# Patient Record
Sex: Male | Born: 1955 | Race: White | Hispanic: No | Marital: Single | Smoking: Current some day smoker
Health system: Southern US, Community
[De-identification: ages and names within clinical notes are randomized; demographics above are authoritative.]

## PROBLEM LIST (undated history)

## (undated) DIAGNOSIS — L719 Rosacea, unspecified: Secondary | ICD-10-CM

## (undated) DIAGNOSIS — T7840XA Allergy, unspecified, initial encounter: Secondary | ICD-10-CM

## (undated) HISTORY — PX: RETINAL DETACHMENT SURGERY: SHX105

## (undated) HISTORY — DX: Allergy, unspecified, initial encounter: T78.40XA

## (undated) HISTORY — DX: Rosacea, unspecified: L71.9

---

## 2003-09-02 ENCOUNTER — Emergency Department (HOSPITAL_COMMUNITY): Admission: EM | Admit: 2003-09-02 | Discharge: 2003-09-02 | Payer: Self-pay | Admitting: Emergency Medicine

## 2006-01-14 ENCOUNTER — Ambulatory Visit: Payer: Self-pay | Admitting: Family Medicine

## 2009-02-07 ENCOUNTER — Ambulatory Visit: Payer: Self-pay | Admitting: Family Medicine

## 2009-05-28 ENCOUNTER — Ambulatory Visit: Payer: Self-pay | Admitting: Family Medicine

## 2009-05-29 ENCOUNTER — Encounter: Admission: RE | Admit: 2009-05-29 | Discharge: 2009-05-29 | Payer: Self-pay | Admitting: Family Medicine

## 2009-05-29 ENCOUNTER — Ambulatory Visit: Payer: Self-pay | Admitting: Family Medicine

## 2009-06-12 ENCOUNTER — Ambulatory Visit: Payer: Self-pay | Admitting: Family Medicine

## 2009-07-24 ENCOUNTER — Ambulatory Visit: Payer: Self-pay | Admitting: Family Medicine

## 2010-07-19 NOTE — Consult Note (Signed)
NAMEWELDEN, Justin Peck                           ACCOUNT NO.:  [id number]   MEDICAL RECORD NO.:  [id number]                   PATIENT TYPE:  EMS   LOCATION:  [facility]                                 FACILITY:  [facility]   PHYSICIAN:  Jefry H. Pollyann Kennedy, M.D.                DATE OF BIRTH:  1955/05/25   DATE OF CONSULTATION:  09/02/2003  DATE OF DISCHARGE:                                   CONSULTATION   SITE:  [facility] emergency department at  11:40 a.m.   HISTORY:  A 55 year old gentleman with a history of severe sore throat since  Thursday, getting worse, mainly on the left side.  He has had difficulty  swallowing today and developed some swelling in the left side of the neck.  There is no prior history of throat problems. He had not seen a physician  for this.   PAST MEDICAL HISTORY:  Negative.   SURGICAL HISTORY:  Negative.   MEDICATIONS:  He smokes a pipe.   PHYSICAL EXAMINATION:  GENERAL:  He is a healthy-appearing gentleman, in  some discomfort, but no distress.  HEENT:  Ears are filled with dried cerumen.  Nasal exam is clear.  Oral  cavity and pharynx significant for mild trismus, fullness and edema of the  left side of his soft palpate with displacement of the tonsil and uvula.  There is some slightly tender adenopathy left anterior jugular chain.   IMPRESSION:  Suspicious for peritonsillar abscess.   PLAN:  Perform incision and drainage.   PROCEDURE NOTE:  Xylocaine 1% with epinephrine was infiltrated into the left  soft palate.  A #18 gauge needle was used to aspirate multiple spots on the  left side of the peritonsillar space.  Did not obtain any pus or any other  material.  Normal amount of involved site bleeding was encountered.  He  tolerated this well.   IMPRESSION:  Peritonsillar cellulitis.   PLAN:  Give intravenous fluids, one liter or normal saline, and repeat it a  second time, if he does not void.  Once he voids, we will let him go home.  We will give him a dose  of intramuscular Bicillin today, and give him a  prescription for narcotic analgesics for pain.  He is instructed to get lots  of rest, drink as much fluid as he can, and to contact me immediately if he  gets any worse, has any difficulty breathing, or goes more than 12 hours  without voiding.                                               Jefry H. Pollyann Kennedy, M.D.    JHR/MEDQ  D:  09/02/2003  T:  09/03/2003  Job:  [id number]   cc:   Sharlot Gowda,  M.D.  [address]  [place], [place] [address]  Fax: [phone]

## 2010-10-04 ENCOUNTER — Ambulatory Visit (INDEPENDENT_AMBULATORY_CARE_PROVIDER_SITE_OTHER): Payer: BC Managed Care – PPO | Admitting: Family Medicine

## 2010-10-04 ENCOUNTER — Encounter: Payer: Self-pay | Admitting: Family Medicine

## 2010-10-04 VITALS — BP 120/80 | HR 70 | Wt 179.0 lb

## 2010-10-04 DIAGNOSIS — Z0000 Encounter for general adult medical examination without abnormal findings: Secondary | ICD-10-CM

## 2010-10-04 DIAGNOSIS — R109 Unspecified abdominal pain: Secondary | ICD-10-CM

## 2010-10-04 LAB — COMPREHENSIVE METABOLIC PANEL
Albumin: 4.8 g/dL (ref 3.5–5.2)
Alkaline Phosphatase: 74 U/L (ref 39–117)
CO2: 26 mEq/L (ref 19–32)
Calcium: 9.4 mg/dL (ref 8.4–10.5)
Chloride: 104 mEq/L (ref 96–112)
Sodium: 139 mEq/L (ref 135–145)
Total Bilirubin: 0.6 mg/dL (ref 0.3–1.2)

## 2010-10-04 LAB — CBC WITH DIFFERENTIAL/PLATELET
Basophils Absolute: 0 10*3/uL (ref 0.0–0.1)
Eosinophils Relative: 2 % (ref 0–5)
HCT: 42.8 % (ref 39.0–52.0)
Hemoglobin: 14.6 g/dL (ref 13.0–17.0)
MCH: 32.7 pg (ref 26.0–34.0)
Monocytes Absolute: 0.6 10*3/uL (ref 0.1–1.0)
Monocytes Relative: 9 % (ref 3–12)
WBC: 6.7 10*3/uL (ref 4.0–10.5)

## 2010-10-04 LAB — LIPID PANEL
Cholesterol: 167 mg/dL (ref 0–200)
Total CHOL/HDL Ratio: 4.9 Ratio
Triglycerides: 309 mg/dL — ABNORMAL HIGH (ref <150)

## 2010-10-04 NOTE — Progress Notes (Signed)
  Subjective:    Patient ID: Justin Peck, male    DOB: Dec 06, 1955, 55 y.o.   MRN: [id number]  HPI He has a one-month history of intermittent lower abdominal pain that is not associated with physical activity, bowel habits, urinating. No nausea, vomiting, diarrhea and he cannot relate this to stress.   Review of Systems     Objective:   Physical Exam Abdominal exam shows active bowel sounds without masses or tenderness. No rebound noted.       Assessment & Plan:  Abdominal pain, etiology unclear He will schedule for complete exam later in the year. I will do routine blood screening on him. We'll also set him up for routine colonoscopy.

## 2010-10-07 ENCOUNTER — Telehealth: Payer: Self-pay

## 2010-10-07 NOTE — Telephone Encounter (Signed)
Called pt to inform left message and mailed diet info

## 2010-10-16 ENCOUNTER — Encounter: Payer: Self-pay | Admitting: Family Medicine

## 2010-11-18 ENCOUNTER — Ambulatory Visit (INDEPENDENT_AMBULATORY_CARE_PROVIDER_SITE_OTHER): Payer: BC Managed Care – PPO | Admitting: Family Medicine

## 2010-11-18 ENCOUNTER — Encounter: Payer: Self-pay | Admitting: Family Medicine

## 2010-11-18 VITALS — BP 122/76 | HR 72 | Wt 179.0 lb

## 2010-11-18 DIAGNOSIS — L259 Unspecified contact dermatitis, unspecified cause: Secondary | ICD-10-CM

## 2010-11-18 MED ORDER — TRIAMCINOLONE ACETONIDE 0.5 % EX OINT: TOPICAL_OINTMENT | Freq: Two times a day (BID) | CUTANEOUS | Status: AC

## 2010-11-18 NOTE — Progress Notes (Signed)
  Subjective:    Patient ID: Justin Peck, male    DOB: 21-Oct-1955, 55 y.o.   MRN: [id number]  HPI He noted a rash on his right shin area after doing some yard work approximately a week ago. He has no lesions other than also on his right arm.   Review of Systems     Objective:   Physical Exam Erythematous raised confluent lesions with some satellite lesions on his right shin. The right forearm does have some linear vesicular lesions.       Assessment & Plan:   1. Contact dermatitis    recommend triamcinolone, cool compresses and Benadryl. He will call if further trouble.

## 2010-11-18 NOTE — Patient Instructions (Signed)
Use the ointment 2 or 3 times a day sparingly. Use cool compresses on the itching and to take Benadryl at night. He can take Claritin during the day or Allegra

## 2010-11-27 ENCOUNTER — Other Ambulatory Visit: Payer: Self-pay | Admitting: Gastroenterology

## 2010-11-27 DIAGNOSIS — R109 Unspecified abdominal pain: Secondary | ICD-10-CM

## 2010-12-03 ENCOUNTER — Ambulatory Visit
Admission: RE | Admit: 2010-12-03 | Discharge: 2010-12-03 | Disposition: A | Discharge status: Home / Self Care | Payer: BC Managed Care – PPO | Source: Ambulatory Visit | Attending: Gastroenterology | Admitting: Gastroenterology

## 2010-12-03 DIAGNOSIS — R109 Unspecified abdominal pain: Secondary | ICD-10-CM

## 2010-12-03 MED ORDER — IOHEXOL 300 MG/ML  SOLN
100.0000 mL | Freq: Once | INTRAMUSCULAR | Status: AC | PRN
  Administered 2010-12-03: 100 mL via INTRAVENOUS

## 2011-05-08 ENCOUNTER — Ambulatory Visit (INDEPENDENT_AMBULATORY_CARE_PROVIDER_SITE_OTHER): Payer: BC Managed Care – PPO | Admitting: Family Medicine

## 2011-05-08 ENCOUNTER — Encounter: Payer: Self-pay | Admitting: Family Medicine

## 2011-05-08 VITALS — BP 130/80 | HR 69 | Ht 69.0 in | Wt 185.0 lb

## 2011-05-08 DIAGNOSIS — E781 Pure hyperglyceridemia: Secondary | ICD-10-CM

## 2011-05-08 DIAGNOSIS — I4510 Unspecified right bundle-branch block: Secondary | ICD-10-CM

## 2011-05-08 DIAGNOSIS — R011 Cardiac murmur, unspecified: Secondary | ICD-10-CM

## 2011-05-08 LAB — LIPID PANEL
LDL Cholesterol: 90 mg/dL (ref 0–99)
Triglycerides: 199 mg/dL — ABNORMAL HIGH (ref <150)
VLDL: 40 mg/dL (ref 0–40)

## 2011-05-08 NOTE — Patient Instructions (Signed)
I will call you when I get the results of the echocardiogram

## 2011-05-08 NOTE — Progress Notes (Signed)
  Subjective:    Patient ID: Justin Peck, male    DOB: 1955-07-30, 56 y.o.   MRN: [id number]  HPI He is here for consultation. He did have a CT scan several months ago which did show a slight cardiomegaly. Since then he is also noted difficulty with chest pressure especially when he lies down at night. Food does not matter. He has had no shortness of breath, weakness. He does not smoke. There is no family history of heart disease. He would also like his lipid panel redrawn. On his last blood draw, he did say that he had some food prior to this.   Review of Systems     Objective:   Physical Exam alert and in no distress. Tympanic membranes and canals are normal. Throat is clear. Tonsils are normal. Neck is supple without adenopathy or thyromegaly. Cardiac exam shows a regular sinus rhythm with a 1-2/6 SEM, no gallops. Lungs are clear to auscultation. EKG shows RBBB.       Assessment & Plan:   1. Hypertriglyceridemia  Lipid panel  2. Murmur, cardiac  PR ELECTROCARDIOGRAM, COMPLETE, 2D Echocardiogram without contrast  3. RBBB

## 2011-05-09 NOTE — Progress Notes (Signed)
Quick Note:  The blood work is normal ______ 

## 2011-05-22 ENCOUNTER — Telehealth: Payer: Self-pay

## 2011-05-22 NOTE — Telephone Encounter (Signed)
Pt called and asked if you have the results of his echo

## 2011-07-02 ENCOUNTER — Encounter: Payer: Self-pay | Admitting: Cardiovascular Disease

## 2012-05-31 ENCOUNTER — Other Ambulatory Visit (HOSPITAL_COMMUNITY): Payer: Self-pay | Admitting: Cardiovascular Disease

## 2012-05-31 DIAGNOSIS — I070 Rheumatic tricuspid stenosis: Secondary | ICD-10-CM

## 2012-06-02 ENCOUNTER — Ambulatory Visit (HOSPITAL_COMMUNITY)
Admission: RE | Admit: 2012-06-02 | Discharge: 2012-06-02 | Disposition: A | Discharge status: Home / Self Care | Payer: BC Managed Care – PPO | Source: Ambulatory Visit | Attending: Cardiovascular Disease | Admitting: Cardiovascular Disease

## 2012-06-02 DIAGNOSIS — I059 Rheumatic mitral valve disease, unspecified: Secondary | ICD-10-CM | POA: Insufficient documentation

## 2012-06-02 DIAGNOSIS — I070 Rheumatic tricuspid stenosis: Secondary | ICD-10-CM

## 2012-06-02 DIAGNOSIS — I079 Rheumatic tricuspid valve disease, unspecified: Secondary | ICD-10-CM | POA: Insufficient documentation

## 2012-06-02 DIAGNOSIS — R011 Cardiac murmur, unspecified: Secondary | ICD-10-CM | POA: Insufficient documentation

## 2012-06-02 DIAGNOSIS — I4510 Unspecified right bundle-branch block: Secondary | ICD-10-CM | POA: Insufficient documentation

## 2012-06-02 NOTE — Progress Notes (Signed)
[facility]   2D echo completed 06/02/2012.   Cindy Phoenyx Melka, RDCS  

## 2012-11-23 ENCOUNTER — Telehealth: Payer: Self-pay | Admitting: Family Medicine

## 2012-11-23 NOTE — Telephone Encounter (Signed)
Pt is requesting a referral to an opthalmology, and would like to see who you would recommend for him.

## 2012-11-26 ENCOUNTER — Telehealth: Payer: Self-pay | Admitting: *Deleted

## 2012-11-26 NOTE — Telephone Encounter (Signed)
Send him to Dr. Groat 

## 2012-11-26 NOTE — Telephone Encounter (Signed)
Left message for patient to call back to let me know what he needs to be seen for and I can get his appointment scheduled with Dr.Groat.

## 2014-11-22 ENCOUNTER — Ambulatory Visit (INDEPENDENT_AMBULATORY_CARE_PROVIDER_SITE_OTHER): Payer: BLUE CROSS/BLUE SHIELD | Admitting: Family Medicine

## 2014-11-22 ENCOUNTER — Ambulatory Visit
Admission: RE | Admit: 2014-11-22 | Discharge: 2014-11-22 | Disposition: A | Discharge status: Home / Self Care | Payer: BLUE CROSS/BLUE SHIELD | Source: Ambulatory Visit | Attending: Family Medicine | Admitting: Family Medicine

## 2014-11-22 ENCOUNTER — Encounter: Payer: Self-pay | Admitting: Family Medicine

## 2014-11-22 VITALS — BP 150/90 | HR 64 | Temp 98.0°F | Wt 180.6 lb

## 2014-11-22 DIAGNOSIS — S3992XA Unspecified injury of lower back, initial encounter: Secondary | ICD-10-CM

## 2014-11-22 MED ORDER — CYCLOBENZAPRINE HCL 10 MG PO TABS: 10.0000 mg | ORAL_TABLET | Freq: Every day | ORAL | Status: DC

## 2014-11-22 NOTE — Progress Notes (Signed)
   Subjective:    Patient ID: Justin Peck, male    DOB: 04-04-1955, 59 y.o.   MRN: [id number]  HPI He is here for a 3 week history of nonradiating right mid thoracic back pain that started after a day of lifting heavy bags of mulch and jumping off a wall over a ditch and "jarring" his back. He reports the pain is worse at night when laying down and in the morning and seems to loosen up throughout the day at times. He has been taking one Aleve every 12 hours for pain and Arnica pills. He denies history of back pain or surgery, or chronic steroid use. He is a smoker. Denies fever, chills, weight loss, fatigue, cough or shortness of breath.   Reviewed allergies, medications, past medical history, surgical and social history.    Review of Systems Pertinent positives and negatives in the history of present illness.    Objective:   Physical Exam  Constitutional: He is oriented to person, place, and time. He appears well-developed and well-nourished. No distress.  Neck: Normal range of motion. Neck supple.  Pulmonary/Chest: Effort normal and breath sounds normal.  Musculoskeletal: Normal range of motion. He exhibits no edema or tenderness.       Arms: Neurological: He is alert and oriented to person, place, and time. He has normal reflexes. Coordination normal.  Skin: Skin is warm and dry. No rash noted. No erythema.  Psychiatric: He has a normal mood and affect. His behavior is normal. Thought content normal.          Assessment & Plan:  Back injury, initial encounter - Plan: DG Thoracic Spine 2 View, CANCELED: DG Thoracic Spine W/Swimmers  His symptoms are most likely musculoskeletal related, i.e. muscle imbalance, however will XR due to traumatic event. Discussed conservative therapy including NSAIDs, muscle relaxer if needed at bedtime, heat 20 minutes 3 times per day and stretching. Also provided him with back exercises and proper spine alignment including good posture.  Will try  conservative therapy for a couple of weeks and he will let me know if he is not improving. Discussed that our next step would be to consider physical therapy. He is aware that his blood pressure is elevated today and states this is most likely due to pain and he will keep an eye on it.

## 2014-11-22 NOTE — Patient Instructions (Addendum)
We will call you with the x-ray result.  Take 2 Aleve every 12 hours OR 800 mg of Ibuprofen every 8 hours. Use heat 20 minutes and stretch after. Try the muscle relaxant at night if needed. Let us know if you're not getting any better.  Back Exercises Back exercises help treat and prevent back injuries. The goal is to increase your strength in your belly (abdominal) and back muscles. These exercises can also help with flexibility. Start these exercises when told by your doctor. HOME CARE Back exercises include: Pelvic Tilt.  Lie on your back with your knees bent. Tilt your pelvis until the lower part of your back is against the floor. Hold this position 5 to 10 sec. Repeat this exercise 5 to 10 times. Knee to Chest.  Pull 1 knee up against your chest and hold for 20 to 30 seconds. Repeat this with the other knee. This may be done with the other leg straight or bent, whichever feels better. Then, pull both knees up against your chest. Sit-Ups or Curl-Ups.  Bend your knees 90 degrees. Start with tilting your pelvis, and do a partial, slow sit-up. Only lift your upper half 30 to 45 degrees off the floor. Take at least 2 to 3 seonds for each sit-up. Do not do sit-ups with your knees out straight. If partial sit-ups are difficult, simply do the above but with only tightening your belly (abdominal) muscles and holding it as told. Hip-Lift.  Lie on your back with your knees flexed 90 degrees. Push down with your feet and shoulders as you raise your hips 2 inches off the floor. Hold for 10 seconds, repeat 5 to 10 times. Back Arches.  Lie on your stomach. Prop yourself up on bent elbows. Slowly press on your hands, causing an arch in your low back. Repeat 3 to 5 times. Shoulder-Lifts.  Lie face down with arms beside your body. Keep hips and belly pressed to floor as you slowly lift your head and shoulders off the floor. Do not overdo your exercises. Be careful in the beginning. Exercises may cause you  some mild back discomfort. If the pain lasts for more than 15 minutes, stop the exercises until you see your doctor. Improvement with exercise for back problems is slow.  Document Released: 03/22/2010 Document Revised: 05/12/2011 Document Reviewed: 12/19/2010 [facility] Patient Information 2015 [place], [place]. This information is not intended to replace advice given to you by your health care provider. Make sure you discuss any questions you have with your health care provider.

## 2014-12-27 ENCOUNTER — Encounter: Payer: Self-pay | Admitting: Family Medicine

## 2014-12-27 ENCOUNTER — Ambulatory Visit (INDEPENDENT_AMBULATORY_CARE_PROVIDER_SITE_OTHER): Payer: BLUE CROSS/BLUE SHIELD | Admitting: Family Medicine

## 2014-12-27 VITALS — BP 124/76 | HR 64 | Temp 97.9°F | Wt 184.6 lb

## 2014-12-27 DIAGNOSIS — H579 Unspecified disorder of eye and adnexa: Secondary | ICD-10-CM

## 2014-12-27 MED ORDER — FLUOROMETHOLONE 0.1 % OP OINT: 1.0000 "application " | TOPICAL_OINTMENT | Freq: Four times a day (QID) | OPHTHALMIC | Status: DC

## 2014-12-27 NOTE — Progress Notes (Signed)
   Subjective:    Patient ID: Justin Peck, male    DOB: [birth year], 59 y.o.   MRN: [id number]  HPI Chief Complaint  Patient presents with  . eyes    eyes itchy and swollen. no vision issues.    He is here for a 2 week history of itching and swelling around his eyes to upper lids and below the eyes. States he has seen opthamologist in past and was prescribed an ointment called FML, a steroid ointment, and has had success every time with this medication but states he is out of the med.  He is not sure what the diagnosis was at that time or the name of the Ophthalmologist he saw.  Has had allergy testing in past. States the symptoms he is presenting with today normally occur in the spring. Denies vision changes, eye drainage, redness or pain. States the problem is only around his eyes and not in the eyes. Denies fever, chills, congestion, drainage.    reviewed allergies, medications, past medical and social history.  Review of Systems Pertinent positives and negatives in the history of present illness.    Objective:   Physical Exam  Constitutional: He is oriented to person, place, and time. He appears well-developed and well-nourished. No distress.  Eyes: Conjunctivae and EOM are normal. Pupils are equal, round, and reactive to light. Right eye exhibits no discharge and no exudate. Left eye exhibits no discharge and no exudate. No scleral icterus.  Mild periorbital swelling noted around bilateral eyes. No erythema, rash, or discharge present   Neurological: He is alert and oriented to person, place, and time. Gait normal.  Psychiatric: He has a normal mood and affect. His speech is normal and behavior is normal. Thought content normal.        Assessment & Plan:  Bilateral eye complaint  Reviewed historical medications and was able to find documentation of the steroid ointment.  Prescription given to patient with instructions to use this around his eyes as he has done in past and to let  me know if no improvement in next 1-2 days.  Patient has seen Dr. Dione Peck in past according to documentation.  Discussed that we may consider a referral back to Dr. Dione Peck if this does not clear it up.

## 2014-12-29 ENCOUNTER — Telehealth: Payer: Self-pay | Admitting: Family Medicine

## 2015-01-03 NOTE — Telephone Encounter (Signed)
P.A. For FML ointment denied, pt must try 2 covered alternatives Pred Forte (prednisolone acetate opthalmic suspension) 1% or Dexamethasone sodium phosphate ophthalmic solution 0.1% or FML liquifilm (fluorometholone ophthalmic suspension) 0.1% Do you want to switch?

## 2015-01-05 NOTE — Telephone Encounter (Signed)
Please call him and let him know that I talked to Dr. Susann GivensLalonde about this and we both recommend that he go back to Dr. Laruth BouchardGroat's office to have this evaluated since we aren't exactly sure what we are treating. We tried to talk to Dr. Dione BoozeGroat office about him and save him the trouble of doing this but they do not have his records since it has been so long since he was there for this problem.  Otherwise, we could try a mild steroid cream like hydrocortisone and see if this helps but if I were him I would go back to eye doctor about it.  Let me know what he would like to do

## 2015-01-05 NOTE — Telephone Encounter (Signed)
Called and left a detailed message for pt about calling and getting an appointment with Dr. Dione BoozeGroat to treat this, or we could try like a mild steroid cream but to call us back and let us know

## 2015-01-05 NOTE — Telephone Encounter (Signed)
Pt states that he paid for the FML ointment out of pocket $125 and got it in [place], [place] as [place] didn't have any since insurance would not cover it.  It cleared it up so he will use that ointment if he has a flare up again and then call Dr. Dione BoozeGroat office and schedule appt.

## 2015-12-05 ENCOUNTER — Encounter: Payer: Self-pay | Admitting: Family Medicine

## 2015-12-05 ENCOUNTER — Ambulatory Visit (INDEPENDENT_AMBULATORY_CARE_PROVIDER_SITE_OTHER): Payer: BLUE CROSS/BLUE SHIELD | Admitting: Family Medicine

## 2015-12-05 VITALS — BP 138/90 | HR 86 | Wt 180.0 lb

## 2015-12-05 DIAGNOSIS — Z125 Encounter for screening for malignant neoplasm of prostate: Secondary | ICD-10-CM

## 2015-12-05 DIAGNOSIS — Z23 Encounter for immunization: Secondary | ICD-10-CM

## 2015-12-05 DIAGNOSIS — E785 Hyperlipidemia, unspecified: Secondary | ICD-10-CM

## 2015-12-05 DIAGNOSIS — R251 Tremor, unspecified: Secondary | ICD-10-CM

## 2015-12-05 DIAGNOSIS — Z79899 Other long term (current) drug therapy: Secondary | ICD-10-CM | POA: Diagnosis not present

## 2015-12-05 DIAGNOSIS — S61211A Laceration without foreign body of left index finger without damage to nail, initial encounter: Secondary | ICD-10-CM

## 2015-12-05 DIAGNOSIS — Z2911 Encounter for prophylactic immunotherapy for respiratory syncytial virus (RSV): Secondary | ICD-10-CM

## 2015-12-05 NOTE — Progress Notes (Signed)
   Subjective:    Patient ID: Justin Peck, male    DOB: [birth year], 60 y.o.   MRN: [id number]  HPI He was initially here for evaluation of a splinter in his right fifth finger near the nail. He also has a laceration to the index finger of the left hand that he wanted evaluated. He then brought up issues of a long history of tremor but he has never had that evaluated. Previous medical history also indicates hyperlipidemia. Review of his record indicates he has not had a shingles vaccine.  Review of Systems     Objective:   Physical Exam He has a healing laceration to the midportion of the index finger over the dorsal surface. No erythema is noted. Exam of the right fifth fingernail does show slight swelling and tenderness to the lateral nail bed.       Assessment & Plan:  Laceration of left index finger without foreign body without damage to nail, initial encounter - Plan: CBC with Differential/Platelet  Tremor - Plan: CBC with Differential/Platelet, Comprehensive metabolic panel, TSH  Hyperlipidemia, unspecified hyperlipidemia type - Plan: Lipid panel  Encounter for long-term (current) use of medications - Plan: CBC with Differential/Platelet, Comprehensive metabolic panel, Lipid panel  Need for shingles vaccine - Plan: Varicella-zoster vaccine subcutaneous  Screening for prostate cancer - Plan: PSA  No intervention needed for the splinter as I'm not sure he really had one as I think he probably removed it himself. The laceration is healing nicely. Discussed some the other issues he is having and recommended that he come in for complete examination with work was drawn in libido of that. He will come back tomorrow to have it drawn. He is to set up for complete examination.

## 2015-12-06 ENCOUNTER — Other Ambulatory Visit: Payer: BLUE CROSS/BLUE SHIELD

## 2015-12-06 ENCOUNTER — Telehealth: Payer: Self-pay | Admitting: Family Medicine

## 2015-12-06 DIAGNOSIS — Z79899 Other long term (current) drug therapy: Secondary | ICD-10-CM

## 2015-12-06 DIAGNOSIS — S61211A Laceration without foreign body of left index finger without damage to nail, initial encounter: Secondary | ICD-10-CM

## 2015-12-06 DIAGNOSIS — R251 Tremor, unspecified: Secondary | ICD-10-CM

## 2015-12-06 DIAGNOSIS — Z125 Encounter for screening for malignant neoplasm of prostate: Secondary | ICD-10-CM

## 2015-12-06 DIAGNOSIS — E785 Hyperlipidemia, unspecified: Secondary | ICD-10-CM

## 2015-12-06 LAB — LIPID PANEL
Cholesterol: 147 mg/dL (ref 125–200)
HDL: 34 mg/dL — AB (ref >=40)
LDL CALC: 81 mg/dL (ref <130)
TRIGLYCERIDES: 162 mg/dL — AB (ref <150)
Total CHOL/HDL Ratio: 4.3 Ratio (ref <=5.0)
VLDL: 32 mg/dL — AB (ref <30)

## 2015-12-06 LAB — CBC WITH DIFFERENTIAL/PLATELET
BASOS ABS: 56 {cells}/uL (ref 0–200)
Basophils Relative: 1 %
EOS PCT: 2 %
Eosinophils Absolute: 112 cells/uL (ref 15–500)
HEMATOCRIT: 41.8 % (ref 38.5–50.0)
HEMOGLOBIN: 14 g/dL (ref 13.2–17.1)
LYMPHS ABS: 1792 {cells}/uL (ref 850–3900)
Lymphocytes Relative: 32 %
MCH: 32.2 pg (ref 27.0–33.0)
MCHC: 33.5 g/dL (ref 32.0–36.0)
MCV: 96.1 fL (ref 80.0–100.0)
MPV: 10.2 fL (ref 7.5–12.5)
Monocytes Absolute: 448 cells/uL (ref 200–950)
Monocytes Relative: 8 %
NEUTROS ABS: 3192 {cells}/uL (ref 1500–7800)
Neutrophils Relative %: 57 %
Platelets: 168 10*3/uL (ref 140–400)
RBC: 4.35 MIL/uL (ref 4.20–5.80)
RDW: 13.2 % (ref 11.0–15.0)
WBC: 5.6 10*3/uL (ref 4.0–10.5)

## 2015-12-06 LAB — COMPREHENSIVE METABOLIC PANEL
ALBUMIN: 4.1 g/dL (ref 3.6–5.1)
ALT: 33 U/L (ref 9–46)
AST: 35 U/L (ref 10–35)
Alkaline Phosphatase: 58 U/L (ref 40–115)
BUN: 21 mg/dL (ref 7–25)
CALCIUM: 8.8 mg/dL (ref 8.6–10.3)
CHLORIDE: 106 mmol/L (ref 98–110)
CO2: 25 mmol/L (ref 20–31)
Creat: 0.96 mg/dL (ref 0.70–1.25)
Glucose, Bld: 99 mg/dL (ref 65–99)
Potassium: 4.1 mmol/L (ref 3.5–5.3)
Sodium: 139 mmol/L (ref 135–146)
Total Bilirubin: 0.7 mg/dL (ref 0.2–1.2)
Total Protein: 6.5 g/dL (ref 6.1–8.1)

## 2015-12-06 LAB — TSH: TSH: 1.25 mIU/L (ref 0.40–4.50)

## 2015-12-06 LAB — PSA: PSA: 0.6 ng/mL (ref <=4.0)

## 2015-12-06 NOTE — Telephone Encounter (Signed)
Pt requested that lab results be mailed be him

## 2017-01-07 IMAGING — CR DG THORACIC SPINE 3V
3 series · 3 of 3 positions shown · non-contrast
Comparison: None.

CLINICAL DATA: Mid thoracic pain, jumped off wall 3 weeks ago

EXAM:
THORACIC SPINE - 3 VIEWS

[view not recorded (1 of 3)]
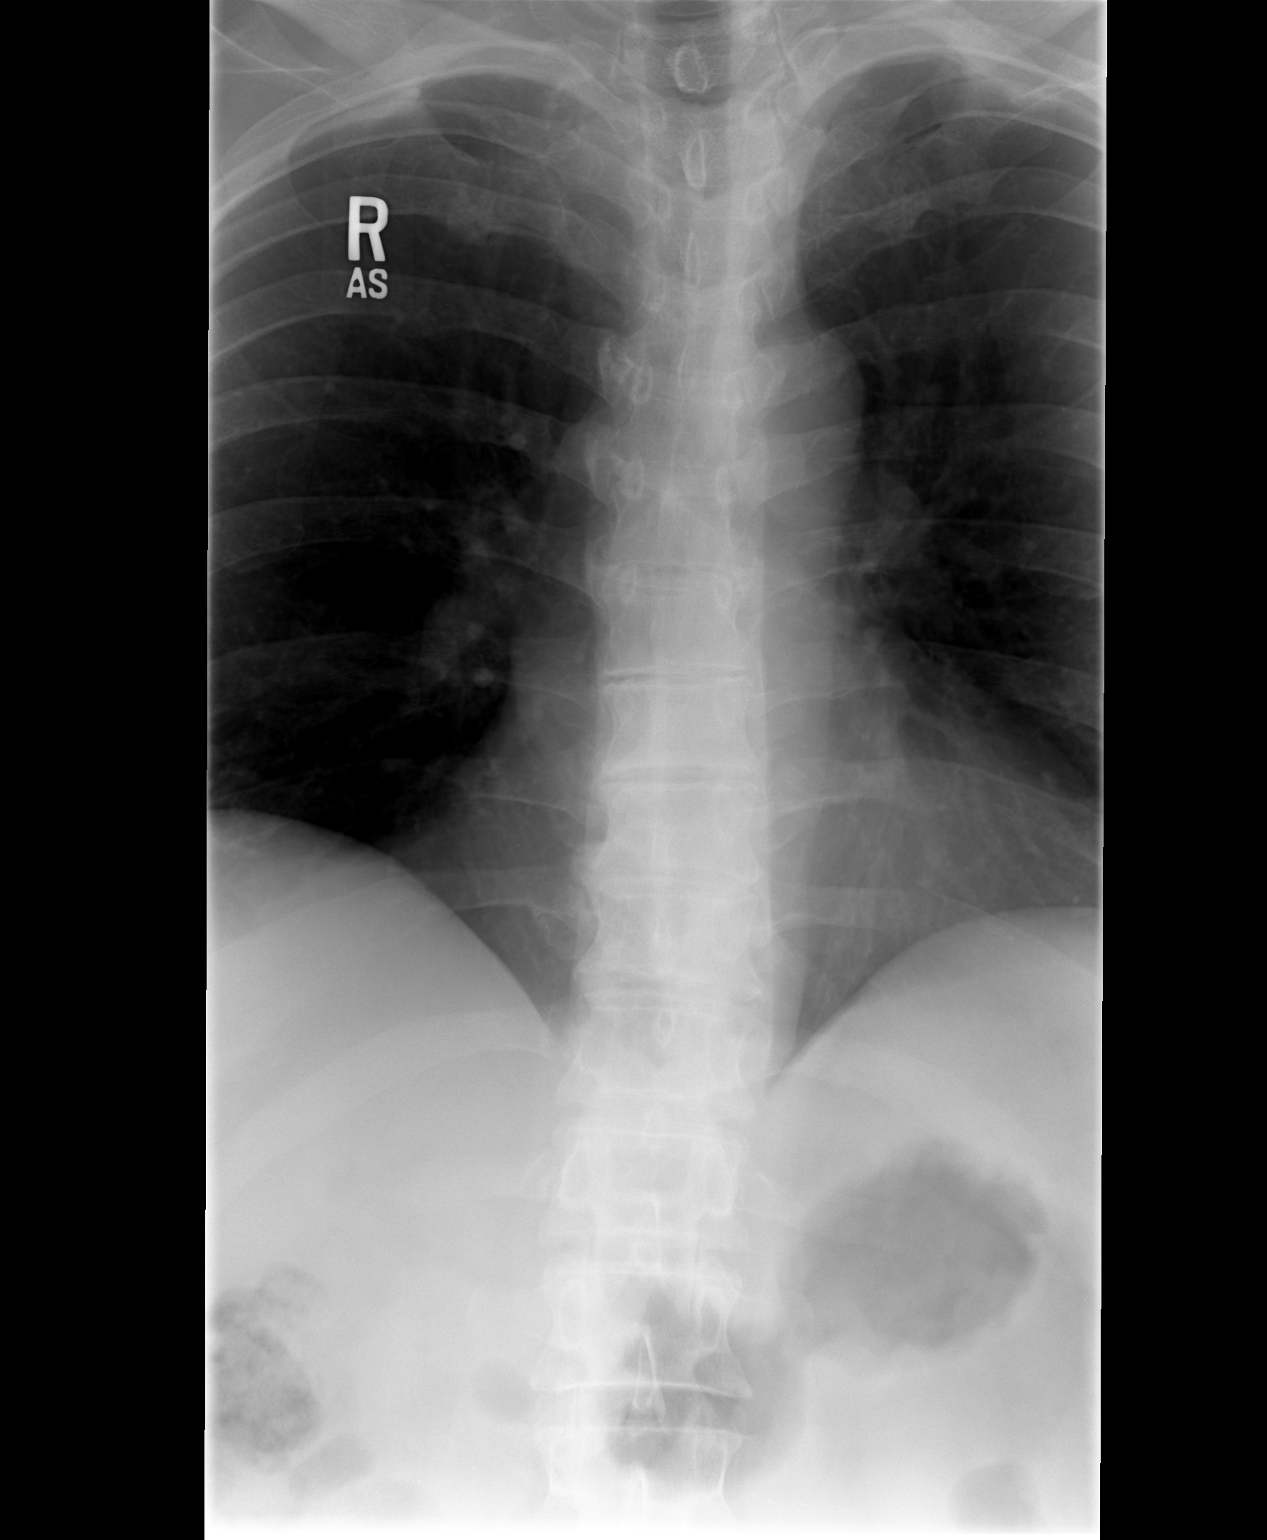

[view not recorded (2 of 3)]
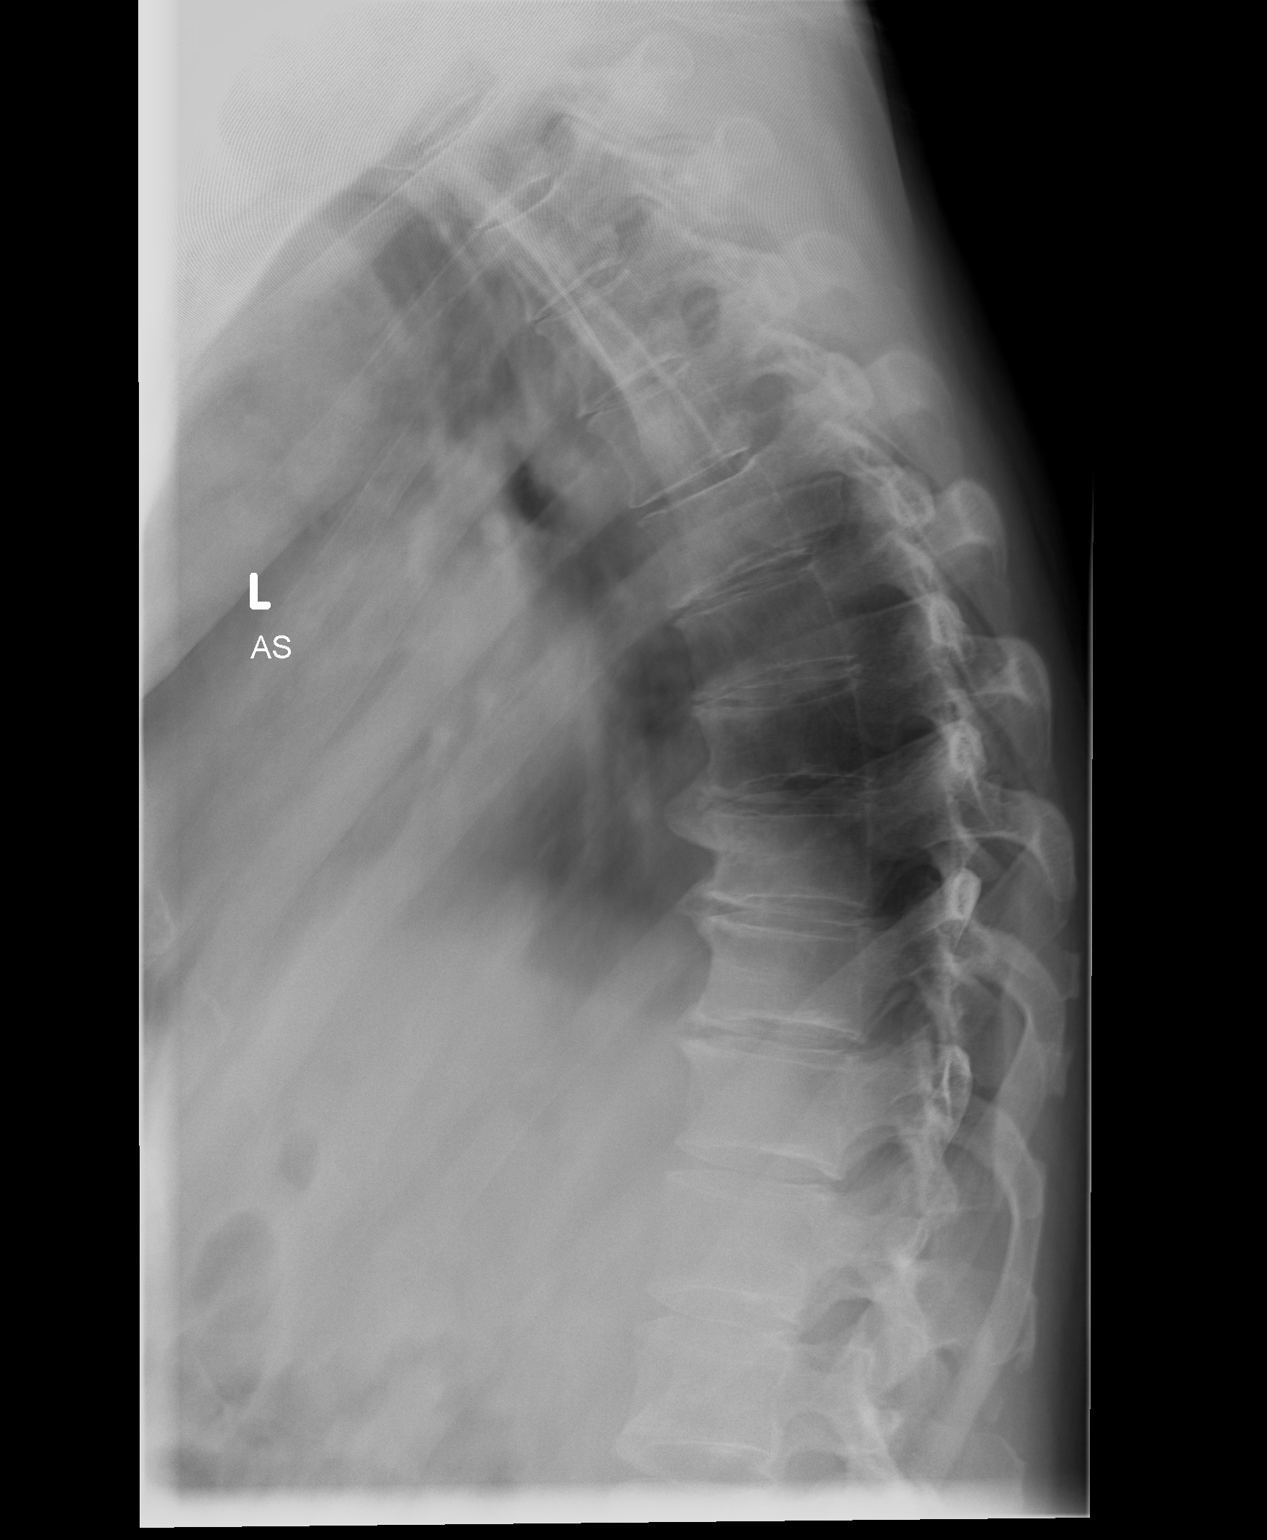

[view not recorded (3 of 3)]
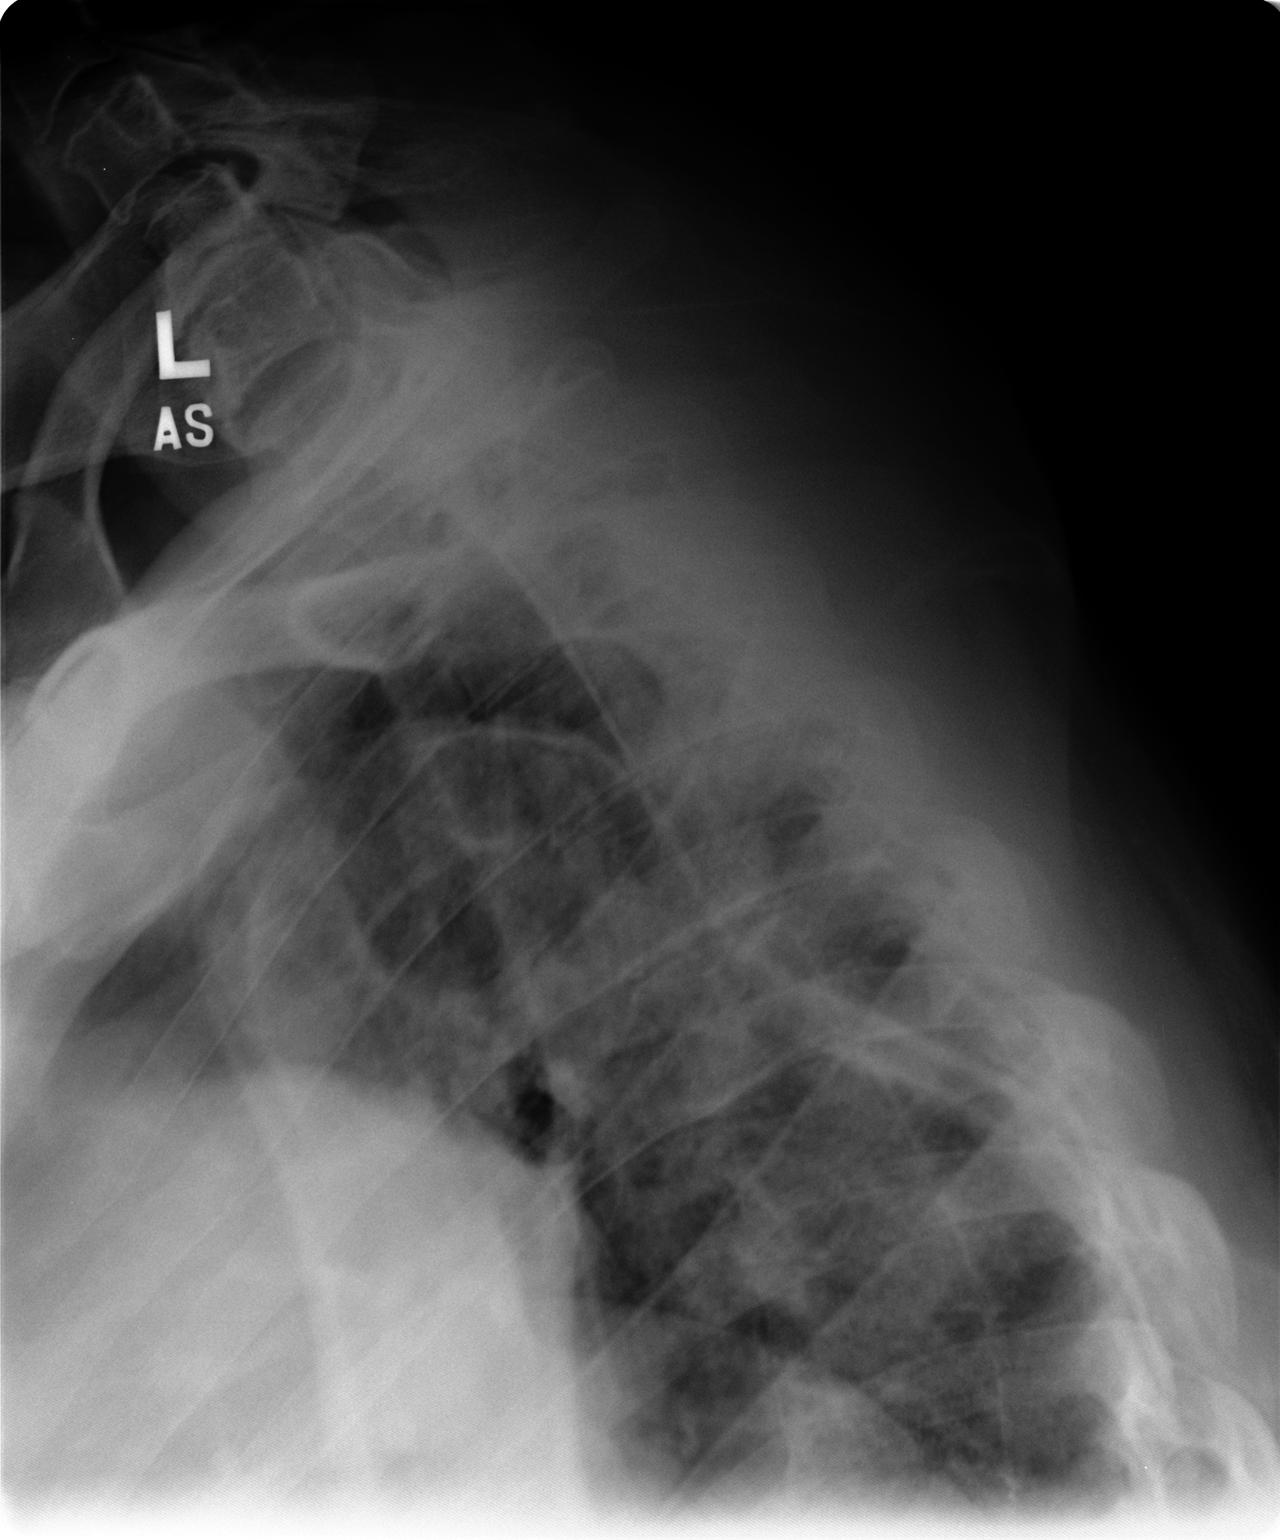

[3 of 3 positions shown; findings below may reference images not displayed]

FINDINGS: The thoracic vertebrae are slightly kyphotic. There is some
degenerative spurring in the lower thoracic spine. However no
compression deformity is seen. No prominent paravertebral soft
tissue is noted.
IMPRESSION: Mild degenerative change.  No acute compression deformity.

## 2019-08-12 ENCOUNTER — Encounter: Payer: Self-pay | Admitting: Family Medicine

## 2019-08-12 ENCOUNTER — Ambulatory Visit (INDEPENDENT_AMBULATORY_CARE_PROVIDER_SITE_OTHER): Payer: Medicare Other | Admitting: Family Medicine

## 2019-08-12 ENCOUNTER — Other Ambulatory Visit: Payer: Self-pay

## 2019-08-12 VITALS — BP 140/82 | HR 60 | Temp 97.7°F | Wt 175.0 lb

## 2019-08-12 DIAGNOSIS — Z13220 Encounter for screening for lipoid disorders: Secondary | ICD-10-CM

## 2019-08-12 DIAGNOSIS — Z125 Encounter for screening for malignant neoplasm of prostate: Secondary | ICD-10-CM

## 2019-08-12 DIAGNOSIS — R1032 Left lower quadrant pain: Secondary | ICD-10-CM | POA: Diagnosis not present

## 2019-08-12 DIAGNOSIS — Z1211 Encounter for screening for malignant neoplasm of colon: Secondary | ICD-10-CM

## 2019-08-12 MED ORDER — AMOXICILLIN-POT CLAVULANATE 875-125 MG PO TABS: 1.0000 | ORAL_TABLET | Freq: Two times a day (BID) | ORAL | 0 refills | Status: DC

## 2019-08-12 NOTE — Progress Notes (Signed)
   Subjective:    Patient ID: BASIR NIVEN, male    DOB: 1955-06-20, 64 y.o.   MRN: [id number]  HPI He is here for evaluation of a 39-month history of difficulty with intermittent left lower quadrant pain but no nausea, vomiting, diarrhea, fever or chills, change in stool or urologic symptoms.  He apparently did have a colonoscopy in 2012.  He was told to return in 10 years. He would also like to have routine blood work.  He is not sure when he can return for complete exam.  Review of Systems     Objective:   Physical Exam Alert and in no distress.  Abdominal exam shows active bowel sounds with slight discomfort in the left lower and mid quadrant.       Assessment & Plan:  Left lower quadrant abdominal pain - Plan: amoxicillin-clavulanate (AUGMENTIN) 875-125 MG tablet, CBC with Differential/Platelet, Comprehensive metabolic panel  Screening for prostate cancer - Plan: PSA  Screening for colon cancer - Plan: Cologuard  Screening for lipid disorders - Plan: Lipid panel I discussed repeat colonoscopy and he has issues with transportation.  I will therefore go ahead and treat him as if he has presumed diverticulitis.  He was comfortable with that.  Recommend he do the Cologuard several weeks after he finishes the course of the antibiotic.  I did discuss the limitations of this approach.  He was comfortable with that.

## 2019-08-13 LAB — COMPREHENSIVE METABOLIC PANEL
ALT: 23 IU/L (ref 0–44)
AST: 28 IU/L (ref 0–40)
Albumin/Globulin Ratio: 1.7 (ref 1.2–2.2)
Albumin: 4.7 g/dL (ref 3.8–4.8)
Alkaline Phosphatase: 87 IU/L (ref 48–121)
BUN/Creatinine Ratio: 21 (ref 10–24)
BUN: 20 mg/dL (ref 8–27)
Bilirubin Total: 0.6 mg/dL (ref 0.0–1.2)
CO2: 22 mmol/L (ref 20–29)
Calcium: 9.6 mg/dL (ref 8.6–10.2)
Chloride: 104 mmol/L (ref 96–106)
Creatinine, Ser: 0.97 mg/dL (ref 0.76–1.27)
GFR calc Af Amer: 94 mL/min/{1.73_m2} (ref >59)
GFR calc non Af Amer: 82 mL/min/{1.73_m2} (ref >59)
Globulin, Total: 2.8 g/dL (ref 1.5–4.5)
Glucose: 89 mg/dL (ref 65–99)
Potassium: 4.6 mmol/L (ref 3.5–5.2)
Sodium: 142 mmol/L (ref 134–144)
Total Protein: 7.5 g/dL (ref 6.0–8.5)

## 2019-08-13 LAB — LIPID PANEL
Chol/HDL Ratio: 4.7 ratio (ref 0.0–5.0)
Cholesterol, Total: 169 mg/dL (ref 100–199)
HDL: 36 mg/dL — ABNORMAL LOW (ref >39)
LDL Chol Calc (NIH): 100 mg/dL — ABNORMAL HIGH (ref 0–99)
Triglycerides: 187 mg/dL — ABNORMAL HIGH (ref 0–149)
VLDL Cholesterol Cal: 33 mg/dL (ref 5–40)

## 2019-08-13 LAB — CBC WITH DIFFERENTIAL/PLATELET
Basophils Absolute: 0.1 10*3/uL (ref 0.0–0.2)
Basos: 1 %
EOS (ABSOLUTE): 0.1 10*3/uL (ref 0.0–0.4)
Eos: 1 %
Hematocrit: 47.1 % (ref 37.5–51.0)
Hemoglobin: 15.3 g/dL (ref 13.0–17.7)
Immature Grans (Abs): 0 10*3/uL (ref 0.0–0.1)
Immature Granulocytes: 0 %
Lymphocytes Absolute: 1.8 10*3/uL (ref 0.7–3.1)
Lymphs: 21 %
MCH: 31.4 pg (ref 26.6–33.0)
MCHC: 32.5 g/dL (ref 31.5–35.7)
MCV: 97 fL (ref 79–97)
Monocytes Absolute: 0.8 10*3/uL (ref 0.1–0.9)
Monocytes: 9 %
Neutrophils Absolute: 5.7 10*3/uL (ref 1.4–7.0)
Neutrophils: 68 %
Platelets: 193 10*3/uL (ref 150–450)
RBC: 4.87 x10E6/uL (ref 4.14–5.80)
RDW: 12.2 % (ref 11.6–15.4)
WBC: 8.5 10*3/uL (ref 3.4–10.8)

## 2019-08-13 LAB — PSA: Prostate Specific Ag, Serum: 0.5 ng/mL (ref 0.0–4.0)

## 2020-07-06 ENCOUNTER — Telehealth: Payer: Self-pay

## 2020-07-06 NOTE — Telephone Encounter (Signed)
LVm for pt to schedule a med check or annual with dr. Susann Givens. KH

## 2020-08-29 ENCOUNTER — Encounter: Payer: Self-pay | Admitting: Family Medicine

## 2021-10-16 NOTE — Progress Notes (Signed)
Chief Complaint  Patient presents with   Back Pain    Right sided back pain inner shoulder blade-has lessened over the last week. Feels better when he is sitting and putting pressure on it. Hurt a lot last when he took deep breath, less this week. About 6 months ago he had very bad pain in left thigh, thought it might just have been a cramp but was not sure. He began having hip and knee weakness since - has gotten better but still concerning. Still has some pain in that R thigh, mild pain that comes and goes.    Patient presents for complaint of pain at R shoulder blade.  This started 7-10 days ago, described a feeling like a knife stabbing him in his back. It hurts with a deep breath. He reports the pain gradually improved over the last week, though still present.  He has done some stretches, no other treatment.  Pressure on it helps. Has just slight discomfort with deep breath.  Second concern is of his LLE.  He reports that 6 months ago, while in bed reading, he got a cramp at his upper L lateral thigh that was severe, lasted for 5 minutes. Sometimes gets cramps in his calves or arches in his feet.  He also reports that for the last month and a half, after sitting for a while, has some lateral knee and L hip pain when he gets up. L knee feels weak, walking up hills/stairs. No issues on the RLE  [place] without pain.  PMH, PSH, SH reviewed. He was last seen in this office for an acute problem 2 years ago. Past due for wellness exam. Pt states he returned his Cologuard (appears as though it expired per Epic). Briefly looked at vaccines--  Immunization History  Administered Date(s) Administered   PFIZER(Purple Top)SARS-COV-2 Vaccination 06/02/2019, 06/27/2019   Tdap 04/11/2010   Zoster, Live 12/05/2015    Outpatient Encounter Medications as of 10/17/2021  Medication Sig   Cholecalciferol (VITAMIN D3 PO) Take by mouth.   L-Theanine 100 MG  CAPS Take 1 Capful by mouth daily.   Multiple Vitamin (MULTIVITAMIN) tablet Take 1 tablet by mouth daily.   Vitamins-Lipotropics (LIPOFLAVONOID PO) Take by mouth.   [DISCONTINUED] amoxicillin-clavulanate (AUGMENTIN) 875-125 MG tablet Take 1 tablet by mouth 2 (two) times daily.   No facility-administered encounter medications on file as of 10/17/2021.   No OTC's or cold meds, or any other OTC's, just those listed above.  No Known Allergies  BP Readings from Last 3 Encounters:  10/17/21 (!) 180/100  08/12/19 140/82  12/05/15 138/90   ROS:  no fever, chills, headaches, dizziness, chest pain (just the pain at R shoulder blade), palpitations, shortness of breath. Muscle cramps per HPI. No edema, bleeding, bruising, rash, numbness, tingling, weakness, GI complaints or other concerns. See HPI   PHYSICAL EXAM:  BP (!) 180/100   Pulse 72   Ht 5\' 10"  (1.778 m)   Wt 170 lb (77.1 kg)   BMI 24.39 kg/m   188/98 on repeat by MD, laying down.  Wt Readings from Last 3 Encounters:  10/17/21 170 lb (77.1 kg)  08/12/19 175 lb (79.4 kg)  12/05/15 180 lb (81.6 kg)   Well-appearing male in no distress HEENT: PERRL, EOMI, conjunctiva and sclera are clear. Neck: no lymphadenopathy or mass Heart: regular rate and rhythm, murmur noted at apex. Lungs: clear bilaterally Back: no spinal or CVA tenderness. He is tender at R  rhomboid muscle, no significant spasm appreciable. Abdomen: soft, nontender Extremities: no edema, normal pulses.  L knee--no effusion, warmth, nontender, negative Lachman. He has mild discomfort with IT band stretch and pyriformis stretch on the L.  ASSESSMENT/PLAN:  Pain of rhomboid muscle - on R. Shown stretches. Heat, massage, stretches. Can use topical meds, tyl/ibu prn. Consider PT, muscle relaxants if persists/worsens  Left leg pain - intermittent. 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 Shown stretches. Consider PT if persists/worsens  Elevated blood  pressure reading without diagnosis of hypertension - Last 2 BP's also above goal, very high today, asympt. Reviewed low sodium diet and rec monitoring. Rec f/u within 4 wks  Vaccine counseling - Prevnar-20 recommended, declined today. Past due for Tdap, to get from pharm. Shingrix rec from pharm. flu and COVID boosters rec in the Fall  Colon cancer screening - pt states he returned Cologuard; no result in Epic (just appears as if it was expired). Will have nurses check on this  Pt was advised to follow low Na diet, and to monitor blood pressure to ensure that it comes down. Encouraged him to keep a lot of BP's, and to fu within 4 weeks and bring list of BP's (and monitor if he buys one). Looking at prior BP's above goal, likely has HTN that needs to be treated. Was NOT in any significant discomfort today to account for high bp. BP goals reviewed.  Advised pt that he is past due for Wellness visit, past due for Tdap, pneumonia vaccine. Declined Prevnar-20 today. Will have nurses check to see if there is any Cologuard result.  Patient advised me that he lives 130 miles away, and made it seem unlikely that he would be returning in the near future for follow-up.  He did say that he has seen cardiology in the past, and could f/u with them.  Advised him I preferred him to f/u with his PCP regarding his BP.  He may in fact be due for a f/u echo, but encouraged him to f/u with his PCP, since due for other preventative items (that the cardiologist wouldn't address, and assured him that his PCP can treat his blood pressure, if it remains high).

## 2021-10-17 ENCOUNTER — Encounter: Payer: Self-pay | Admitting: Family Medicine

## 2021-10-17 ENCOUNTER — Ambulatory Visit (INDEPENDENT_AMBULATORY_CARE_PROVIDER_SITE_OTHER): Payer: Medicare Other | Admitting: Family Medicine

## 2021-10-17 VITALS — BP 180/100 | HR 72 | Ht 70.0 in | Wt 170.0 lb

## 2021-10-17 DIAGNOSIS — M7918 Myalgia, other site: Secondary | ICD-10-CM

## 2021-10-17 DIAGNOSIS — Z7185 Encounter for immunization safety counseling: Secondary | ICD-10-CM

## 2021-10-17 DIAGNOSIS — R030 Elevated blood-pressure reading, without diagnosis of hypertension: Secondary | ICD-10-CM

## 2021-10-17 DIAGNOSIS — M79605 Pain in left leg: Secondary | ICD-10-CM | POA: Diagnosis not present

## 2021-10-17 DIAGNOSIS — Z1211 Encounter for screening for malignant neoplasm of colon: Secondary | ICD-10-CM

## 2021-10-17 NOTE — Patient Instructions (Addendum)
You are past due to tetanus (Tdap). You can get this from the pharmacy.  You need to schedule a wellness visit with Dr. Susann Givens. You are due for a pneumonia vaccine (Prevnar 20) Yearly flu shots and new COVID booster is recommended when available. New RSV vaccine can also be considered, when available.  I recommend getting the new shingles vaccine (Shingrix). Since you have Medicare, you will need to get this from the pharmacy, as it is covered by Part D. This is a series of 2 injections, spaced 2 months apart.   This should be separated from other vaccines by at least 2 weeks.   Please follow a low sodium diet. Monitor your blood pressure periodically. Please write the values down, and bring the list to your visit. Goal blood pressure is 130/80 or less.  For the back pain--use heat 15-20 minutes 3 times daily, followed by stretches and/or massage. If the acute pain recurs, we can consider a muscle relaxant. Since it is significantly better, heat and stretching should help this resolve. If your blood pressure was lower, I'd recommend anti-inflammatories.   Try tylenol before bedtime so that pain doesn't interfere with sleep.  Your leg pain may be related to a tight iliotibial band (which goes from the hip to the outer part of the knee). Do the stretches as shown. I also recommend the pyriformis stretch we did (figure of 4 stretch).

## 2021-10-23 ENCOUNTER — Telehealth: Payer: Self-pay

## 2021-10-23 ENCOUNTER — Other Ambulatory Visit: Payer: Self-pay

## 2021-10-23 ENCOUNTER — Telehealth: Payer: Self-pay | Admitting: Family Medicine

## 2021-10-23 DIAGNOSIS — Z1211 Encounter for screening for malignant neoplasm of colon: Secondary | ICD-10-CM

## 2021-10-23 NOTE — Telephone Encounter (Signed)
Called pt of advise a new cologurad kit being sent out to him. The pt order had expired before the kit was returned. Sample must be done within 60 days of receiving the kit to ensure it can be processed.  Kh

## 2021-10-23 NOTE — Telephone Encounter (Signed)
Pt called and message from Maudie Mercury was relayed. Pt states the kit needs to be sent to a different address. ]  Please send to:  [address] [place], [place] [address]

## 2021-10-23 NOTE — Telephone Encounter (Signed)
Done KH 

## 2021-11-07 ENCOUNTER — Telehealth: Payer: Self-pay | Admitting: Family Medicine

## 2021-11-07 NOTE — Telephone Encounter (Signed)
Spoke to patient to schedule AWVI 05/01/20 per [place].  Patient stated if it is not mandatory he did not want to do

## 2021-11-15 ENCOUNTER — Ambulatory Visit (INDEPENDENT_AMBULATORY_CARE_PROVIDER_SITE_OTHER): Payer: Medicare Other | Admitting: Family Medicine

## 2021-11-15 ENCOUNTER — Other Ambulatory Visit: Payer: Self-pay | Admitting: Family Medicine

## 2021-11-15 ENCOUNTER — Encounter: Payer: Self-pay | Admitting: Family Medicine

## 2021-11-15 VITALS — BP 150/90 | HR 83 | Temp 98.2°F | Wt 166.4 lb

## 2021-11-15 DIAGNOSIS — Z1159 Encounter for screening for other viral diseases: Secondary | ICD-10-CM

## 2021-11-15 DIAGNOSIS — R3911 Hesitancy of micturition: Secondary | ICD-10-CM

## 2021-11-15 DIAGNOSIS — Z13220 Encounter for screening for lipoid disorders: Secondary | ICD-10-CM

## 2021-11-15 DIAGNOSIS — Z125 Encounter for screening for malignant neoplasm of prostate: Secondary | ICD-10-CM | POA: Diagnosis not present

## 2021-11-15 DIAGNOSIS — I10 Essential (primary) hypertension: Secondary | ICD-10-CM | POA: Diagnosis not present

## 2021-11-15 MED ORDER — LOSARTAN POTASSIUM 50 MG PO TABS: 50.0000 mg | ORAL_TABLET | Freq: Every day | ORAL | 1 refills | Status: DC

## 2021-11-15 NOTE — Progress Notes (Signed)
   Subjective:    Patient ID: Justin Peck, male    DOB: [birth year], 66 y.o.   MRN: [id number]  HPI He is here for a recheck.  Since last being seen he did make dietary changes especially cutting back on sodium.  He did get a blood pressure cuff for his wrist and has been checking his blood pressure regularly.  He keeps himself physically active.  Does complain of some slight urinary hesitancy and has concerns about PSA.  He has not been set up for complete exam yet.   Review of Systems     Objective:   Physical Exam Alert and in no distress.  Blood pressure is recorded.       Assessment & Plan:  Hypertension, unspecified type - Plan: losartan (COZAAR) 50 MG tablet, CBC with Differential/Platelet, Comprehensive metabolic panel, Lipid panel  Screening for lipid disorders - Plan: Lipid panel  Need for hepatitis C screening test - Plan: Hepatitis C antibody  Screening for prostate cancer - Plan: PSA  Hesitancy of micturition - Plan: PSA I explained that I think is now time to treat his blood pressure.  He is to return here in 1 month and bring a different blood pressure cuff so we compare it to ours.  He is also to set up for complete exam.  4 months.  He was comfortable with that.

## 2021-11-16 LAB — COMPREHENSIVE METABOLIC PANEL
ALT: 28 IU/L (ref 0–44)
AST: 25 IU/L (ref 0–40)
Albumin/Globulin Ratio: 2.1 (ref 1.2–2.2)
Albumin: 5 g/dL — ABNORMAL HIGH (ref 3.9–4.9)
Alkaline Phosphatase: 78 IU/L (ref 44–121)
BUN/Creatinine Ratio: 19 (ref 10–24)
BUN: 20 mg/dL (ref 8–27)
Bilirubin Total: 0.8 mg/dL (ref 0.0–1.2)
CO2: 23 mmol/L (ref 20–29)
Calcium: 9.7 mg/dL (ref 8.6–10.2)
Chloride: 102 mmol/L (ref 96–106)
Creatinine, Ser: 1.08 mg/dL (ref 0.76–1.27)
Globulin, Total: 2.4 g/dL (ref 1.5–4.5)
Glucose: 106 mg/dL — ABNORMAL HIGH (ref 70–99)
Potassium: 4.8 mmol/L (ref 3.5–5.2)
Sodium: 140 mmol/L (ref 134–144)
Total Protein: 7.4 g/dL (ref 6.0–8.5)
eGFR: 75 mL/min/{1.73_m2} (ref >59)

## 2021-11-16 LAB — CBC WITH DIFFERENTIAL/PLATELET
Basophils Absolute: 0.1 10*3/uL (ref 0.0–0.2)
Basos: 1 %
EOS (ABSOLUTE): 0.1 10*3/uL (ref 0.0–0.4)
Eos: 2 %
Hematocrit: 44.2 % (ref 37.5–51.0)
Hemoglobin: 15.1 g/dL (ref 13.0–17.7)
Immature Grans (Abs): 0 10*3/uL (ref 0.0–0.1)
Immature Granulocytes: 0 %
Lymphocytes Absolute: 1.5 10*3/uL (ref 0.7–3.1)
Lymphs: 26 %
MCH: 32.7 pg (ref 26.6–33.0)
MCHC: 34.2 g/dL (ref 31.5–35.7)
MCV: 96 fL (ref 79–97)
Monocytes Absolute: 0.5 10*3/uL (ref 0.1–0.9)
Monocytes: 9 %
Neutrophils Absolute: 3.6 10*3/uL (ref 1.4–7.0)
Neutrophils: 62 %
Platelets: 150 10*3/uL (ref 150–450)
RBC: 4.62 x10E6/uL (ref 4.14–5.80)
RDW: 11.9 % (ref 11.6–15.4)
WBC: 5.7 10*3/uL (ref 3.4–10.8)

## 2021-11-16 LAB — LIPID PANEL
Chol/HDL Ratio: 4.4 ratio (ref 0.0–5.0)
Cholesterol, Total: 149 mg/dL (ref 100–199)
HDL: 34 mg/dL — ABNORMAL LOW (ref >39)
LDL Chol Calc (NIH): 89 mg/dL (ref 0–99)
Triglycerides: 150 mg/dL — ABNORMAL HIGH (ref 0–149)
VLDL Cholesterol Cal: 26 mg/dL (ref 5–40)

## 2021-11-16 LAB — PSA: Prostate Specific Ag, Serum: 0.6 ng/mL (ref 0.0–4.0)

## 2021-11-16 LAB — COLOGUARD: COLOGUARD: NEGATIVE

## 2021-11-16 LAB — HEPATITIS C ANTIBODY: Hep C Virus Ab: NONREACTIVE

## 2021-12-12 ENCOUNTER — Other Ambulatory Visit: Payer: Self-pay | Admitting: Family Medicine

## 2021-12-12 DIAGNOSIS — I10 Essential (primary) hypertension: Secondary | ICD-10-CM

## 2021-12-12 NOTE — Telephone Encounter (Signed)
Lvm for pt to call back to advise if he needs this before his appt 12/26/21. Bertram

## 2021-12-12 NOTE — Telephone Encounter (Signed)
Pt. Called stating he does need his losartan refilled today or tomorrow because he only has a few tablets left and he si going out of town tomorrow.

## 2021-12-26 ENCOUNTER — Ambulatory Visit (INDEPENDENT_AMBULATORY_CARE_PROVIDER_SITE_OTHER): Payer: Medicare Other | Admitting: Family Medicine

## 2021-12-26 VITALS — BP 167/118 | HR 93 | Temp 98.3°F | Wt 171.4 lb

## 2021-12-26 DIAGNOSIS — I10 Essential (primary) hypertension: Secondary | ICD-10-CM | POA: Diagnosis not present

## 2021-12-26 MED ORDER — LOSARTAN POTASSIUM-HCTZ 100-12.5 MG PO TABS: 1.0000 | ORAL_TABLET | Freq: Every day | ORAL | 1 refills | Status: DC

## 2021-12-26 NOTE — Progress Notes (Signed)
   Subjective:    Patient ID: Justin Peck, male    DOB: Feb 22, 1956, 66 y.o.   MRN: [id number]  HPI He is here for recheck on his blood pressure.  He did get a cuff with his however there is a big discrepancy between his cuff and ours. 7/18 higher.  I then checked the blood pressure cuff on me and he gave a fairly accurate reading.   Review of Systems     Objective:   Physical Exam Alert and in no distress.  Blood pressure is recorded.       Assessment & Plan:  Hypertension, unspecified type - Plan: losartan-hydrochlorothiazide (HYZAAR) 100-12.5 MG tablet I will increase his blood pressure medication.  Discussed checking his blood pressure less often stating that once a week is plenty.  Discussed proper blood pressure taking technique with him.  He will return here in 1 month for recheck on that.

## 2021-12-27 ENCOUNTER — Other Ambulatory Visit: Payer: Self-pay | Admitting: Family Medicine

## 2021-12-27 DIAGNOSIS — I10 Essential (primary) hypertension: Secondary | ICD-10-CM

## 2022-01-17 ENCOUNTER — Other Ambulatory Visit: Payer: Self-pay | Admitting: Family Medicine

## 2022-01-17 DIAGNOSIS — I10 Essential (primary) hypertension: Secondary | ICD-10-CM

## 2022-01-22 ENCOUNTER — Ambulatory Visit (INDEPENDENT_AMBULATORY_CARE_PROVIDER_SITE_OTHER): Payer: Medicare Other | Admitting: Family Medicine

## 2022-01-22 ENCOUNTER — Encounter: Payer: Self-pay | Admitting: Family Medicine

## 2022-01-22 DIAGNOSIS — I10 Essential (primary) hypertension: Secondary | ICD-10-CM | POA: Diagnosis not present

## 2022-01-22 MED ORDER — LOSARTAN POTASSIUM-HCTZ 100-12.5 MG PO TABS: 1.0000 | ORAL_TABLET | Freq: Every day | ORAL | 3 refills | Status: DC

## 2022-01-22 NOTE — Progress Notes (Signed)
   Subjective:    Patient ID: Justin Peck, male    DOB: February 16, 1956, 66 y.o.   MRN: [id number]  HPI He is here for recheck on his blood pressure.  He states that when he takes the medicine he does occasionally have difficulty with dizziness but has not had any problems with urinating.  He does take that sometimes during the day.  He does have a blood pressure cuff and has been measured against ours and is accurate.   Review of Systems     Objective:   Physical Exam Alert and in no distress.  Blood pressure is recorded.       Assessment & Plan:  Hypertension, unspecified type - Plan: losartan-hydrochlorothiazide (HYZAAR) 100-12.5 MG tablet I will continue his present blood pressure medication although it is not at 130/80 it is still within the range that susceptible.  We will keep monitoring this and if it starts to get above 140/90 he will let me know and we we will probably have to add another medication to his regimen.  He was comfortable with that.

## 2022-01-29 ENCOUNTER — Ambulatory Visit: Payer: Medicare Other | Admitting: Family Medicine

## 2023-02-27 ENCOUNTER — Other Ambulatory Visit: Payer: Self-pay | Admitting: Family Medicine

## 2023-02-27 DIAGNOSIS — I10 Essential (primary) hypertension: Secondary | ICD-10-CM

## 2023-04-30 ENCOUNTER — Ambulatory Visit: Payer: Medicare Other | Admitting: Family Medicine

## 2023-05-25 ENCOUNTER — Other Ambulatory Visit: Payer: Self-pay | Admitting: Family Medicine

## 2023-05-25 DIAGNOSIS — I10 Essential (primary) hypertension: Secondary | ICD-10-CM

## 2023-06-03 ENCOUNTER — Other Ambulatory Visit: Payer: Self-pay | Admitting: Family Medicine

## 2023-06-03 DIAGNOSIS — I10 Essential (primary) hypertension: Secondary | ICD-10-CM

## 2023-06-03 NOTE — Telephone Encounter (Signed)
 Copied from CRM [id number]. Topic: Clinical - Medication Refill >> Jun 03, 2023 12:59 PM Tiffany S wrote: Most Recent Primary Care Visit:  Provider: Ronnald Nian  Department: [facility]  Visit Type: FOLLOW UP 30  Date: 01/22/2022  Medication: losartan-hydrochlorothiazide (HYZAAR) 100-12.5 MG tablet [[id number]]  Has the patient contacted their pharmacy? Yes (Agent: If no, request that the patient contact the pharmacy for the refill. If patient does not wish to contact the pharmacy document the reason why and proceed with request.) (Agent: If yes, when and what did the pharmacy advise?)  Is this the correct pharmacy for this prescription? Yes If no, delete pharmacy and type the correct one.  This is the patient's preferred pharmacy:  [facility] [address] [place], [place] - [address] AT [facility] [address] [place] [address] Phone: [phone] Fax: [phone]   Has the prescription been filled recently? Yes  Is the patient out of the medication? Yes  Has the patient been seen for an appointment in the last year OR does the patient have an upcoming appointment? Yes  Can we respond through MyChart? No  Agent: Please be advised that Rx refills may take up to 3 business days. We ask that you follow-up with your pharmacy.

## 2023-06-03 NOTE — Telephone Encounter (Signed)
 Duplicate sent in on 3/34/25

## 2023-06-04 ENCOUNTER — Ambulatory Visit: Payer: Medicare Other | Admitting: Family Medicine

## 2023-06-04 ENCOUNTER — Encounter: Payer: Self-pay | Admitting: Family Medicine

## 2023-06-04 VITALS — BP 130/80 | HR 72 | Ht 69.0 in | Wt 178.4 lb

## 2023-06-04 DIAGNOSIS — Z0000 Encounter for general adult medical examination without abnormal findings: Secondary | ICD-10-CM

## 2023-06-04 DIAGNOSIS — I350 Nonrheumatic aortic (valve) stenosis: Secondary | ICD-10-CM | POA: Insufficient documentation

## 2023-06-04 DIAGNOSIS — Z23 Encounter for immunization: Secondary | ICD-10-CM | POA: Diagnosis not present

## 2023-06-04 DIAGNOSIS — Z7185 Encounter for immunization safety counseling: Secondary | ICD-10-CM

## 2023-06-04 DIAGNOSIS — I4510 Unspecified right bundle-branch block: Secondary | ICD-10-CM

## 2023-06-04 DIAGNOSIS — I10 Essential (primary) hypertension: Secondary | ICD-10-CM

## 2023-06-04 DIAGNOSIS — E782 Mixed hyperlipidemia: Secondary | ICD-10-CM

## 2023-06-04 LAB — LIPID PANEL

## 2023-06-04 MED ORDER — LOSARTAN POTASSIUM-HCTZ 100-12.5 MG PO TABS: 1.0000 | ORAL_TABLET | Freq: Every day | ORAL | 0 refills | Status: DC

## 2023-06-04 NOTE — Progress Notes (Signed)
 Justin Peck is a 68 y.o. male who presents for annual wellness visit and follow-up on chronic medical conditions.  He continues on his blood pressure medication and has had no difficulty with that.  He also has an underlying history of aortic stenosis but has not had any chest pain, syncopal episodes, shortness of breath, DOE.  He has not been seen by cardiology in over 10 years.  He has no other concerns or complaints.  Review of the record indicates he is behind on his immunizations but he states that he has had the immunizations and will send [place] the data concerning that.   Immunizations and Health Maintenance Immunization History  Administered Date(s) Administered   PFIZER(Purple Top)SARS-COV-2 Vaccination 06/02/2019, 06/27/2019, 01/30/2020, 09/18/2020   PNEUMOCOCCAL CONJUGATE-20 06/04/2023   Pfizer Covid-19 Vaccine Bivalent Booster 44yrs & up 03/26/2021   Tdap 04/11/2010   Zoster Recombinant(Shingrix) 06/03/2022   Zoster, Live 12/05/2015   Health Maintenance Due  Topic Date Due   DTaP/Tdap/Td (2 - Td or Tdap) 04/11/2020   Zoster Vaccines- Shingrix (2 of 2) 07/29/2022   COVID-19 Vaccine (6 - 2024-25 season) 11/02/2022    Last colonoscopy: cologaurd 11/12/2021 due next year  Last PSA: 11/15/2021 Dentist: appointment in may 2025 Ophtho: 2024 Exercise: no  because of shoulder injury   Other doctors caring for patient include: Dentist - dr, Erick Alley Optho: Dr. Burgess Estelle  Parodonitis: dr. Pilar Plate  Advanced Directives:no     Depression screen:  See questionnaire below.        06/04/2023   10:02 AM 10/17/2021    3:43 PM 08/12/2019    2:20 PM  Depression screen PHQ 2/9  Decreased Interest 0 0 0  Down, Depressed, Hopeless 0 0 0  PHQ - 2 Score 0 0 0    Fall Screen: See Questionaire below.      06/04/2023   10:02 AM 10/17/2021    3:42 PM  Fall Risk   Falls in the past year? 0 0  Number falls in past yr: 0 0  Injury with Fall? 0 0  Risk for fall due to : No  Fall Risks No Fall Risks  Follow up Falls evaluation completed Falls evaluation completed    ADL screen:  See questionnaire below.  Functional Status Survey:     Review of Systems  Constitutional: -, -unexpected weight change, -anorexia, -fatigue Allergy: -sneezing, -itching, -congestion Dermatology: denies changing moles, rash, lumps ENT: -runny nose, -ear pain, -sore throat,  Cardiology:  -chest pain, -palpitations, -orthopnea, Respiratory: -cough, -shortness of breath, -dyspnea on exertion, -wheezing,  Gastroenterology: -abdominal pain, -nausea, -vomiting, -diarrhea, -constipation, -dysphagia Hematology: -bleeding or bruising problems Musculoskeletal: -arthralgias, -myalgias, -joint swelling, -back pain, - Ophthalmology: -vision changes,  Urology: -dysuria, -difficulty urinating,  -urinary frequency, -urgency, incontinence Neurology: -, -numbness, , -memory loss, -falls, -dizziness    PHYSICAL EXAM:   General Appearance: Alert, cooperative, no distress, appears stated age Head: Normocephalic, without obvious abnormality, atraumatic Eyes: PERRL, conjunctiva/corneas clear, EOM's intact, Ears: Normal TM's and external ear canals Nose: Nares normal, mucosa normal, no drainage or sinus   tenderness Throat: Lips, mucosa, and tongue normal; teeth and gums normal Neck: Supple, no lymphadenopathy, thyroid:no enlargement/tenderness/nodules; no carotid bruit or JVD Lungs: Clear to auscultation bilaterally without wheezes, rales or ronchi; respirations unlabored Heart: Regular rate and rhythm, S1 and S2 normal,3/6 SEM Abdomen: Soft, non-tender, nondistended, normoactive bowel sounds, no masses, no hepatosplenomegaly Skin: Skin color, texture, turgor normal, no rashes or lesions Lymph nodes: Cervical, supraclavicular, and axillary  nodes normal Neurologic: CNII-XII intact, normal strength, sensation and gait; reflexes 2+ and symmetric throughout   Psych: Normal mood, affect, hygiene  and grooming The 10-year ASCVD risk score (Arnett DK, et al., 2019) is: 27.2%   Values used to calculate the score:     Age: 32 years     Sex: Male     Is Non-Hispanic African American: No     Diabetic: No     Tobacco smoker: Yes     Systolic Blood Pressure: 130 mmHg     Is BP treated: Yes     HDL Cholesterol: 32 mg/dL     Total Cholesterol: 165 mg/dL  ASSESSMENT/PLAN: Nonrheumatic aortic valve stenosis - Plan: CBC with Differential/Platelet, Comprehensive metabolic panel with GFR, Ambulatory referral to Cardiology  RBBB  Hypertension, unspecified type - Plan: CBC with Differential/Platelet, Comprehensive metabolic panel with GFR, losartan-hydrochlorothiazide (HYZAAR) 100-12.5 MG tablet  Vaccine counseling  Mixed hyperlipidemia - Plan: Lipid panel  Need for vaccination against Streptococcus pneumoniae - Plan: Pneumococcal conjugate vaccine 20-valent (Prevnar 20)    Discussed at least 30 minutes of aerobic activity at least 5 days/week; . Immunization recommendations discussed.  Colonoscopy recommendations reviewed.   Medicare Attestation I have personally reviewed: The patient's medical and social history Their use of alcohol, tobacco or illicit drugs Their current medications and supplements The patient's functional ability including ADLs,fall risks, home safety risks, cognitive, and hearing and visual impairment Diet and physical activities Evidence for depression or mood disorders  The patient's weight, height, and BMI have been recorded in the chart.  I have made referrals, counseling, and provided education to the patient based on review of the above and I have provided the patient with a written personalized care plan for preventive services.     Sharlot Gowda, MD   06/04/2023

## 2023-06-05 LAB — COMPREHENSIVE METABOLIC PANEL WITH GFR
ALT: 32 IU/L (ref 0–44)
AST: 33 IU/L (ref 0–40)
Albumin: 4.8 g/dL (ref 3.9–4.9)
Alkaline Phosphatase: 73 IU/L (ref 44–121)
BUN/Creatinine Ratio: 23 (ref 10–24)
BUN: 21 mg/dL (ref 8–27)
Bilirubin Total: 0.6 mg/dL (ref 0.0–1.2)
CO2: 22 mmol/L (ref 20–29)
Calcium: 9.6 mg/dL (ref 8.6–10.2)
Chloride: 100 mmol/L (ref 96–106)
Creatinine, Ser: 0.93 mg/dL (ref 0.76–1.27)
Globulin, Total: 2.3 g/dL (ref 1.5–4.5)
Glucose: 104 mg/dL — ABNORMAL HIGH (ref 70–99)
Potassium: 4.2 mmol/L (ref 3.5–5.2)
Sodium: 138 mmol/L (ref 134–144)
Total Protein: 7.1 g/dL (ref 6.0–8.5)
eGFR: 89 mL/min/{1.73_m2} (ref >59)

## 2023-06-05 LAB — CBC WITH DIFFERENTIAL/PLATELET
Basophils Absolute: 0.1 10*3/uL (ref 0.0–0.2)
Basos: 1 %
EOS (ABSOLUTE): 0.1 10*3/uL (ref 0.0–0.4)
Eos: 1 %
Hematocrit: 43.3 % (ref 37.5–51.0)
Hemoglobin: 14.9 g/dL (ref 13.0–17.7)
Immature Grans (Abs): 0 10*3/uL (ref 0.0–0.1)
Immature Granulocytes: 0 %
Lymphocytes Absolute: 1.5 10*3/uL (ref 0.7–3.1)
Lymphs: 22 %
MCH: 33 pg (ref 26.6–33.0)
MCHC: 34.4 g/dL (ref 31.5–35.7)
MCV: 96 fL (ref 79–97)
Monocytes Absolute: 0.5 10*3/uL (ref 0.1–0.9)
Monocytes: 7 %
Neutrophils Absolute: 4.7 10*3/uL (ref 1.4–7.0)
Neutrophils: 69 %
Platelets: 183 10*3/uL (ref 150–450)
RBC: 4.52 x10E6/uL (ref 4.14–5.80)
RDW: 11.9 % (ref 11.6–15.4)
WBC: 6.9 10*3/uL (ref 3.4–10.8)

## 2023-06-05 LAB — LIPID PANEL
Cholesterol, Total: 165 mg/dL (ref 100–199)
HDL: 32 mg/dL — ABNORMAL LOW (ref >39)
LDL CALC COMMENT:: 5.2 ratio — ABNORMAL HIGH (ref 0.0–5.0)
LDL Chol Calc (NIH): 96 mg/dL (ref 0–99)
Triglycerides: 218 mg/dL — ABNORMAL HIGH (ref 0–149)
VLDL Cholesterol Cal: 37 mg/dL (ref 5–40)

## 2023-06-05 MED ORDER — ROSUVASTATIN CALCIUM 20 MG PO TABS: 20.0000 mg | ORAL_TABLET | Freq: Every day | ORAL | 3 refills | Status: DC

## 2023-06-05 NOTE — Addendum Note (Signed)
 Addended by: Ronnald Nian on: 06/05/2023 08:26 AM   Modules accepted: Orders

## 2023-08-27 ENCOUNTER — Ambulatory Visit: Payer: Self-pay

## 2023-08-27 ENCOUNTER — Telehealth: Payer: Self-pay | Admitting: Family Medicine

## 2023-08-27 ENCOUNTER — Telehealth: Payer: Self-pay

## 2023-08-27 DIAGNOSIS — I10 Essential (primary) hypertension: Secondary | ICD-10-CM

## 2023-08-27 MED ORDER — LOSARTAN POTASSIUM-HCTZ 100-12.5 MG PO TABS: 1.0000 | ORAL_TABLET | Freq: Every day | ORAL | 0 refills | Status: DC

## 2023-08-27 NOTE — Telephone Encounter (Signed)
 Left voicemail for patient

## 2023-08-27 NOTE — Telephone Encounter (Unsigned)
 Copied from CRM [id number]. Topic: Clinical - Medication Refill >> Aug 27, 2023 11:40 AM Myrick T wrote: Medication: losartan -hydrochlorothiazide (HYZAAR) 100-12.5 MG tablet   Has the patient contacted their pharmacy? No  This is the patient's preferred pharmacy:  [id number] [place], [place] - [address] AT [facility] [address] [place] [address] Phone: [phone] Fax: [phone]  Is this the correct pharmacy for this prescription? Yes  Has the prescription been filled recently? Yes  Is the patient out of the medication? Yes  Has the patient been seen for an appointment in the last year OR does the patient have an upcoming appointment? Yes  Can we respond through MyChart? Yes  Agent: Please be advised that Rx refills may take up to 3 business days. We ask that you follow-up with your pharmacy.

## 2023-08-27 NOTE — Telephone Encounter (Signed)
 Left voice mail

## 2023-08-27 NOTE — Telephone Encounter (Signed)
 Pt will not be back to the [place] area for about 3 hours. Pt cannot do a VV d/t no access to smart phone or computer. Pt would like call back ASAP  FYI Only or Action Required?: Action required by provider: request for appointment.  Patient was last seen in primary care on 06/04/2023 by Joyce Norleen BROCKS, MD. Called Nurse Triage reporting Rash. Symptoms began a week ago. Interventions attempted: Nothing. Symptoms are: gradually worsening.  Triage Disposition: See Physician Within 24 Hours  Patient/caregiver understands and will follow disposition?: yes  Copied from CRM [id number]. Topic: Clinical - Red Word Triage >> Aug 27, 2023  8:18 AM Treva T wrote: [facility] that prompted transfer to Nurse Triage: Patient states has a bad case of poison sumac/ivy. Rash, itching, irritation, redness, blisters, and discharge/pus from blisters. Reason for Disposition  MODERATE to SEVERE itching (e.g., interferes with work, school, sleep, or other activities)  Answer Assessment - Initial Assessment Questions 1. APPEARANCE of RASH: Describe the rash.      Red, streaks, blisters, pus 2. LOCATION: Where is the rash located?  (e.g., face, genitals, hands, legs)     BUE, chest, BLE 4. ONSET: When did the rash begin?      Came in contact a week ago 5. ITCHING: Does the rash itch? If Yes, ask: How bad is it?   - MILD - doesn't interfere with normal activities   - MODERATE-SEVERE: interferes with work, school, sleep, or other activities      severe 6. EXPOSURE:  How were you exposed to the plant (poison ivy, poison oak, sumac)  When were you exposed?       Poison sumac 7. PAST HISTORY: Have you had a poison ivy rash before? If Yes, ask: How bad was it?     Yes poison sumac  Protocols used: Poison Ivy - Oak - Sumac-A-AH

## 2023-08-27 NOTE — Telephone Encounter (Signed)
 Copied from CRM [id number]. Topic: Clinical - Medical Advice >> Aug 27, 2023 11:36 AM Myrick T wrote: Reason for CRM: patient called stated he needs a steroid for poison sumac/ivy. He is experiencing Rash, itching, irritation, redness, blisters, and discharge/pus from blisters. Please f/u with patient

## 2023-08-28 ENCOUNTER — Encounter: Payer: Self-pay | Admitting: Medical

## 2023-08-28 ENCOUNTER — Ambulatory Visit (INDEPENDENT_AMBULATORY_CARE_PROVIDER_SITE_OTHER): Admitting: Medical

## 2023-08-28 VITALS — BP 138/90 | HR 91 | Wt 176.2 lb

## 2023-08-28 DIAGNOSIS — L237 Allergic contact dermatitis due to plants, except food: Secondary | ICD-10-CM

## 2023-08-28 DIAGNOSIS — R21 Rash and other nonspecific skin eruption: Secondary | ICD-10-CM | POA: Diagnosis not present

## 2023-08-28 MED ORDER — TRIAMCINOLONE ACETONIDE 0.1 % EX CREA: 1.0000 | TOPICAL_CREAM | Freq: Two times a day (BID) | CUTANEOUS | 0 refills | Status: DC

## 2023-08-28 MED ORDER — PREDNISONE 10 MG PO TABS: ORAL_TABLET | ORAL | 0 refills | Status: DC

## 2023-08-28 MED ORDER — HYDROXYZINE HCL 10 MG PO TABS: 10.0000 mg | ORAL_TABLET | Freq: Three times a day (TID) | ORAL | 0 refills | Status: DC | PRN

## 2023-08-28 NOTE — Progress Notes (Signed)
 Subjective:   Justin Peck is a 68 y.o. male who presents for evaluation of a rash involving the right abdomen, both arms, some on right leg.  Blistery, itchy.  Has been trimming hedges and got into poison ivy and poison sumac. Rash started a week ago.   So far has been using all terrain soap for poison ivy.  Using itch relief spray. Using some OTC topica hydrocortisone with marginal relief.  Used some benadryl cream with no relief.  Had some chills.   Patient denies: fever, joint aches, nausea. No other aggravating or relieving factors.  No other c/o.   Past Medical History:  Diagnosis Date   Allergy    RHINITIS   Rosacea    Seborrheic dermatitis    Current Outpatient Medications on File Prior to Visit  Medication Sig Dispense Refill   Cholecalciferol (VITAMIN D3 PO) Take by mouth.     L-Theanine 100 MG CAPS Take 1 Capful by mouth daily.     losartan -hydrochlorothiazide (HYZAAR) 100-12.5 MG tablet Take 1 tablet by mouth daily. 90 tablet 0   MAGNESIUM PO Take by mouth.     Multiple Vitamin (MULTIVITAMIN) tablet Take 1 tablet by mouth daily.     rosuvastatin  (CRESTOR ) 20 MG tablet Take 1 tablet (20 mg total) by mouth daily. 90 tablet 3   Vitamins-Lipotropics (LIPOFLAVONOID PO) Take by mouth.     No current facility-administered medications on file prior to visit.    The following portions of the patient's history were reviewed and updated as appropriate: allergies, current medications, past family history, past medical history, past social history and problem list.  Review of Systems As in subjective above   Objective:   Gen: wd, wn, nad Skin: Right upper abdomen with large patch of erythema and small 1 to 2 mm bullous lesions scattered throughout.  similar patches of erythema rea of erythema, approximately 13 cm x 5 to 8 cm wide similar rash on the right arm except dark red coloration with both linear and round urticarial lesions as well, and a similar 1 to 2 mm bullous  lesions in that patch of erythema.  Similar but smaller patches on the left forearm and right leg just below the knee   Assessment:   Encounter Diagnoses  Name Primary?   Contact dermatitis due to poison sumac Yes   Rash       Plan:   Discussed symptoms and exam findings, diagnosis, treatment options.   Begin hydroxyzine oral tablet 2-3 times a day to help with itching, caution on sedation Begin prednisone steroid taper by mouth Begin triamcinolone  cream to the rash but not on the face. Symptoms should significantly improve over the next 3 to 5 days. If not much improved in the next few days and call or recheck  Discussed washing contaminated clothing on the hot cycle in the washing machine, washing gloves, shoes, or other utensils in hot soapy water.  Avoid re- exposure.    [name] was seen today for acute visit.  Diagnoses and all orders for this visit:  Contact dermatitis due to poison sumac  Rash  Other orders -     triamcinolone  cream (KENALOG ) 0.1 %; Apply 1 Application topically 2 (two) times daily. -     hydrOXYzine (ATARAX) 10 MG tablet; Take 1 tablet (10 mg total) by mouth 3 (three) times daily as needed. -     predniSONE (DELTASONE) 10 MG tablet; 6 tablets all together day 1, 5 tablets day 2, 4 tablets  day 3, 3 tablets day 4, 2 tablets day 5, 1 tablet day 6.   Follow up prn, or if not much improved in the next 4-5 days.

## 2023-09-01 ENCOUNTER — Encounter: Payer: Self-pay | Admitting: Family Medicine

## 2023-09-09 ENCOUNTER — Telehealth: Payer: Self-pay

## 2023-09-09 ENCOUNTER — Other Ambulatory Visit: Payer: Self-pay

## 2023-09-09 DIAGNOSIS — E782 Mixed hyperlipidemia: Secondary | ICD-10-CM

## 2023-09-09 NOTE — Telephone Encounter (Signed)
 Copied from CRM [id number]. Topic: Clinical - Prescription Issue >> Sep 09, 2023 11:19 AM Graeme ORN wrote: Reason for CRM: Patient called. States he was not sure that he was supposed to be taking medication rosuvastatin  (CRESTOR ) 20 MG tablet. He sent message to provider and provider states that it was prescribed to patient. Shows sent to pharmacy in April with refills.Patient never picked it up. States he did not ever get notification it was ready.  Patient confirms correct pharmacy. Patient would like someone to reach out to pharmacy to they can have it filled for him. Would like an update via MyChart. Thank You

## 2024-02-22 ENCOUNTER — Other Ambulatory Visit: Payer: Self-pay | Admitting: Family Medicine

## 2024-02-22 DIAGNOSIS — I10 Essential (primary) hypertension: Secondary | ICD-10-CM

## 2024-02-22 NOTE — Telephone Encounter (Signed)
 Called patient to schedule appt, LVM for him to call back to schedule.

## 2024-02-23 ENCOUNTER — Telehealth: Payer: Self-pay

## 2024-02-23 DIAGNOSIS — I350 Nonrheumatic aortic (valve) stenosis: Secondary | ICD-10-CM

## 2024-02-23 DIAGNOSIS — I10 Essential (primary) hypertension: Secondary | ICD-10-CM

## 2024-02-23 DIAGNOSIS — E782 Mixed hyperlipidemia: Secondary | ICD-10-CM

## 2024-02-23 MED ORDER — LOSARTAN POTASSIUM-HCTZ 100-12.5 MG PO TABS: 1.0000 | ORAL_TABLET | Freq: Every day | ORAL | 1 refills | Status: AC

## 2024-02-23 MED ORDER — ROSUVASTATIN CALCIUM 20 MG PO TABS: 20.0000 mg | ORAL_TABLET | Freq: Every day | ORAL | 1 refills | Status: AC

## 2024-02-23 NOTE — Addendum Note (Signed)
 Addended by: JOYCE NORLEEN BROCKS on: 02/23/2024 10:26 AM   Modules accepted: Orders

## 2024-02-23 NOTE — Telephone Encounter (Signed)
 Called Pt and LVM for him to call back.

## 2024-02-23 NOTE — Telephone Encounter (Signed)
 Copied from CRM #[id number]. Topic: Clinical - Prescription Issue >> Feb 22, 2024  4:45 PM Justin Peck wrote: Reason for CRM: Patient called saying he has been calling the  [facility] and when he gets them they say they don't have an appt.  He is [place] right now but her does need a refill [place] and his generic Crestor .  [phone]

## 2024-02-23 NOTE — Telephone Encounter (Signed)
 Spoke with Patient, nothing more to do except wait for heart care to call him. Wants to see Dr. Francyne.....  Copied from CRM #[id number]. Topic: General - Other >> Feb 23, 2024 11:03 AM Treva T wrote: Reason for CRM: Pt calling states he is returning call to office.  Per chart review/notes of provider: Let him know that I called the medication in and put another referral in for cardiology.  Verify the drugstore.  Called and spoke to office, per front desk, ok to relay to pt per office.  Informed pt of above, verbalizes understanding, however does have questions regarding referral to cardiology.   Pt requesting a follow call regarding cardiology referral and reason.  Pt preferred call back number is [phone].  Pt is aware of same day call back.    Pt preferred pharmacy: [id number] [address], [place] - [address] AT [address] [place] [address] Phone: [phone] Fax: [phone]

## 2024-03-17 ENCOUNTER — Encounter: Payer: Self-pay | Admitting: Family Medicine

## 2024-03-17 ENCOUNTER — Ambulatory Visit: Admitting: Family Medicine

## 2024-03-17 VITALS — BP 146/92 | HR 74 | Ht 70.0 in | Wt 180.2 lb

## 2024-03-17 DIAGNOSIS — G8929 Other chronic pain: Secondary | ICD-10-CM

## 2024-03-17 DIAGNOSIS — I4510 Unspecified right bundle-branch block: Secondary | ICD-10-CM | POA: Diagnosis not present

## 2024-03-17 DIAGNOSIS — M25512 Pain in left shoulder: Secondary | ICD-10-CM | POA: Diagnosis not present

## 2024-03-17 DIAGNOSIS — I350 Nonrheumatic aortic (valve) stenosis: Secondary | ICD-10-CM

## 2024-03-17 DIAGNOSIS — M25511 Pain in right shoulder: Secondary | ICD-10-CM

## 2024-03-17 DIAGNOSIS — I10 Essential (primary) hypertension: Secondary | ICD-10-CM | POA: Diagnosis not present

## 2024-03-17 NOTE — Progress Notes (Signed)
" ° °  Subjective:    Patient ID: Justin Peck, male    DOB: 09/27/1955, 69 y.o.   MRN: [id number]  Discussed the use of AI scribe software for clinical note transcription with the patient, who gave verbal consent to proceed.  History of Present Illness   Justin Peck is a 69 year old male with tricuspid stenosis who presents for a follow-up visit.  He has a history of tricuspid stenosis, initially evaluated in 2014, with an echocardiogram performed in April 2020. He recalls a previous evaluation where he was shown a video of his tricuspid valve not closing properly, and was told by his cardiologist that he probably had the condition for years.  He experiences shoulder pain, particularly when sleeping on his side. He has not engaged in weightlifting for seven or eight years but is considering resuming with lighter weights due to his shoulder concerns. The pain improves by midday, and he believes he does not have any cartilage left in his shoulders.  He reports knee pain, especially at the bottom of his patellas, attributed to kneeling while refinishing floors last fall. The pain is described as feeling like 'someone's driving a nail in there' when pressure is applied, but it does not occur when walking or running up steps.  He received a pneumococcal vaccination a year ago and expresses frustration with the billing process, although he did not have to pay out of pocket.  He spends most of his time in [place] , where he owns a [place]front home, recently paid off. He also owns a home in [place], which is fully paid off. The [place]  property requires significant maintenance due to its [place], [place]ed terrain.           Review of Systems     Objective:    Physical Exam  Alert and in no distress otherwise not examined           Assessment & Plan:     Primary hypertension Blood pressure is well-controlled.  Chronic shoulder pain Likely due to  prolonged side sleeping. Pain improves by midday. Potential benefit from shoulder rehabilitation exercises. - Start with low weight resistance exercises, beginning with 5-pound weights. - Consider physical therapy for shoulder rehabilitation if pain persists. - Explore online resources for shoulder rehab exercises.  Chronic knee pain Exacerbated by kneeling, likely due to pressure on the patella. No pain during walking or running. - Avoid kneeling on hard surfaces. - Consider using knee pads during activities that require kneeling.  General health maintenance Discussed CoQ10 supplementation and starting a weightlifting program. CoQ10 is considered safe with potential benefits. Weightlifting should be approached cautiously. - Continue CoQ10 supplementation if desired. - Start weightlifting with low weights, gradually increasing as tolerated. - Schedule wellness physical in a couple of months. - Ensure Cologuard test is sent to the correct address.        "

## 2024-03-23 NOTE — Progress Notes (Signed)
 " Cardiology Office Note:    Date:  03/26/2024   ID:  Justin Peck, DOB 10-Sep-1955, MRN [id number]  PCP:  Joyce Norleen BROCKS, MD  Cardiologist:  None  Electrophysiologist:  None   Referring MD: Joyce Norleen BROCKS, MD   Chief Complaint  Patient presents with   Cardiac Valve Problem    History of Present Illness:    Justin Peck is a 69 y.o. male with a hx of hypertension, hyperlipidemia who was referred by Dr. Joyce for evaluation of valvular heart disease.  Echocardiogram in 2014 showed EF 55 to 60%, mitral valve prolapse with mild mitral regurgitation, mild to moderate tricuspid regurgitation.  Denies any chest pain, dyspnea,  lower extremity edema, or palpitations.  Reports some lightheadedness but no syncope.  Reports was having palpitations but none recently.  Smokes pipe daily.  Denies any family history of heart disease.   Past Medical History:  Diagnosis Date   Allergy    RHINITIS   Rosacea    Seborrheic dermatitis     Past Surgical History:  Procedure Laterality Date   RETINAL DETACHMENT SURGERY      Current Medications: Active Medications[1]   Allergies:   Tetracyclines & related   Social History   Socioeconomic History   Marital status: Single    Spouse name: Not on file   Number of children: Not on file   Years of education: Not on file   Highest education level: Bachelor's degree (e.g., BA, AB, BS)  Occupational History   Not on file  Tobacco Use   Smoking status: Some Days    Types: Pipe   Smokeless tobacco: Never  Substance and Sexual Activity   Alcohol use: Not on file   Drug use: Not on file   Sexual activity: Not on file  Other Topics Concern   Not on file  Social History Narrative   Not on file   Social Drivers of Health   Tobacco Use: High Risk (03/25/2024)   Patient History    Smoking Tobacco Use: Some Days    Smokeless Tobacco Use: Never    Passive Exposure: Not on file  Financial Resource Strain: Low Risk (03/11/2024)    Overall Financial Resource Strain (CARDIA)    Difficulty of Paying Living Expenses: Not hard at all  Food Insecurity: No Food Insecurity (03/11/2024)   Epic    Worried About Radiation Protection Practitioner of Food in the Last Year: Never true    Ran Out of Food in the Last Year: Never true  Transportation Needs: No Transportation Needs (03/11/2024)   Epic    Lack of Transportation (Medical): No    Lack of Transportation (Non-Medical): No  Physical Activity: Sufficiently Active (03/11/2024)   Exercise Vital Sign    Days of Exercise per Week: 6 days    Minutes of Exercise per Session: 60 min  Stress: No Stress Concern Present (03/11/2024)   [employer] of Occupational Health - Occupational Stress Questionnaire    Feeling of Stress: Not at all  Social Connections: Unknown (03/11/2024)   Social Connection and Isolation Panel    Frequency of Communication with Friends and Family: Twice a week    Frequency of Social Gatherings with Friends and Family: Not on file    Attends Religious Services: Never    Active Member of Clubs or Organizations: No    Attends Banker Meetings: Not on file    Marital Status: Never married  Depression (PHQ2-9): Low Risk (03/17/2024)   Depression (  PHQ2-9)    PHQ-2 Score: 0  Alcohol Screen: Low Risk (03/11/2024)   Alcohol Screen    Last Alcohol Screening Score (AUDIT): 2  Housing: Low Risk (03/11/2024)   Epic    Unable to Pay for Housing in the Last Year: No    Number of Times Moved in the Last Year: 0    Homeless in the Last Year: No  Utilities: Not At Risk (03/17/2024)   Epic    Threatened with loss of utilities: No  Health Literacy: Adequate Health Literacy (03/17/2024)   B1300 Health Literacy    Frequency of need for help with medical instructions: Never     Family History: The patient's family history is not on file.  ROS:   Please see the history of present illness.     All other systems reviewed and are negative.  EKGs/Labs/Other Studies Reviewed:    The  following studies were reviewed today:   EKG:   03/25/2024: Normal sinus rhythm, rate 73, right bundle branch block  Recent Labs: 06/04/2023: ALT 32; BUN 21; Creatinine, Ser 0.93; Hemoglobin 14.9; Platelets 183; Potassium 4.2; Sodium 138  Recent Lipid Panel    Component Value Date/Time   CHOL 165 06/04/2023 1123   TRIG 218 (H) 06/04/2023 1123   HDL 32 (L) 06/04/2023 1123   CHOLHDL 5.2 (H) 06/04/2023 1123   CHOLHDL 4.3 12/06/2015 0821   VLDL 32 (H) 12/06/2015 0821   LDLCALC 96 06/04/2023 1123    Physical Exam:    VS:  BP (!) 140/80 (BP Location: Right Arm, Patient Position: Sitting, Cuff Size: Normal)   Pulse 71   Ht 5' 10 (1.778 m)   Wt 180 lb (81.6 kg)   SpO2 98%   BMI 25.83 kg/m     Wt Readings from Last 3 Encounters:  03/25/24 180 lb (81.6 kg)  03/17/24 180 lb 3.2 oz (81.7 kg)  08/28/23 176 lb 3.2 oz (79.9 kg)     GEN:  Well nourished, well developed in no acute distress HEENT: Normal NECK: No JVD; No carotid bruits LYMPHATICS: No lymphadenopathy CARDIAC: RRR, 2 out of 6 systolic murmur, last at apex RESPIRATORY:  Clear to auscultation without rales, wheezing or rhonchi  ABDOMEN: Soft, non-tender, non-distended MUSCULOSKELETAL:  No edema; No deformity  SKIN: Warm and dry NEUROLOGIC:  Alert and oriented x 3 PSYCHIATRIC:  Normal affect   ASSESSMENT:    1. Mitral valve insufficiency, unspecified etiology   2. Heart murmur   3. Essential hypertension   4. Encounter to establish care   5. Hyperlipidemia, unspecified hyperlipidemia type   6. Tobacco use    PLAN:    Valvular heart disease: Echocardiogram in 2014 showed EF 55 to 60%, mitral valve prolapse with mild mitral regurgitation, mild to moderate tricuspid regurgitation. - Update echocardiogram  Hypertension: On losartan -HCTZ 100-12.5 mg daily.  BP mildly elevated in clinic today, asked to check BP twice daily for next week and let us  know results  Hyperlipidemia: LDL 96 on 06/2023, started rosuvastatin   20 mg daily.  Update lipid panel  Tobacco use: Counseled on risk of tobacco use and cessation strongly encouraged  RTC in 6 months   Medication Adjustments/Labs and Tests Ordered: Current medicines are reviewed at length with the patient today.  Concerns regarding medicines are outlined above.  Orders Placed This Encounter  Procedures   Basic Metabolic Panel (BMET)   Magnesium   Lipid panel   EKG 12-Lead   ECHOCARDIOGRAM COMPLETE   No orders of the defined  types were placed in this encounter.   Patient Instructions  Medication Instructions:  Your physician recommends that you continue on your current medications as directed. Please refer to the Current Medication list given to you today.  *If you need a refill on your cardiac medications before your next appointment, please call your pharmacy*  Lab Work: Bmet, mg, lipid panel today If you have labs (blood work) drawn today and your tests are completely normal, you will receive your results only by: MyChart Message (if you have MyChart) OR A paper copy in the mail If you have any lab test that is abnormal or we need to change your treatment, we will call you to review the results.  Testing/Procedures: Echo Your physician has requested that you have an echocardiogram. Echocardiography is a painless test that uses sound waves to create images of your heart. It provides your doctor with information about the size and shape of your heart and how well your hearts chambers and valves are working. This procedure takes approximately one hour. There are no restrictions for this procedure. Please do NOT wear cologne, perfume, aftershave, or lotions (deodorant is allowed). Please arrive 15 minutes prior to your appointment time.  Please note: We ask at that you not bring children with you during ultrasound (echo/ vascular) testing. Due to room size and safety concerns, children are not allowed in the ultrasound rooms during exams. Our  front office staff cannot provide observation of children in our lobby area while testing is being conducted. An adult accompanying a patient to their appointment will only be allowed in the ultrasound room at the discretion of the ultrasound technician under special circumstances. We apologize for any inconvenience.   Follow-Up: At [facility], you and your health needs are our priority.  As part of our continuing mission to provide you with exceptional heart care, our providers are all part of one team.  This team includes your primary Cardiologist (physician) and Advanced Practice Providers or APPs (Physician Assistants and Nurse Practitioners) who all work together to provide you with the care you need, when you need it.  Your next appointment:   6 months  Provider:   Dr. Kate  We recommend signing up for the patient portal called MyChart.  Sign up information is provided on this After Visit Summary.  MyChart is used to connect with patients for Virtual Visits (Telemedicine).  Patients are able to view lab/test results, encounter notes, upcoming appointments, etc.  Non-urgent messages can be sent to your provider as well.   To learn more about what you can do with MyChart, go to [email or web address].   Other Instructions Please check blood pressure twice a day for one week and send those readings           Signed, Lonni LITTIE Kate, MD  03/26/2024 9:06 PM    [facility]     [1]  Current Meds  Medication Sig   Cholecalciferol (VITAMIN D3 PO) Take by mouth.   L-Theanine 100 MG CAPS Take 1 Capful by mouth daily.   losartan -hydrochlorothiazide (HYZAAR) 100-12.5 MG tablet Take 1 tablet by mouth daily.   MAGNESIUM PO Take by mouth.   Multiple Vitamin (MULTIVITAMIN) tablet Take 1 tablet by mouth daily.   rosuvastatin  (CRESTOR ) 20 MG tablet Take 1 tablet (20 mg total) by mouth daily.   Vitamins-Lipotropics (LIPOFLAVONOID PO) Take by  mouth.   "

## 2024-03-25 ENCOUNTER — Ambulatory Visit: Attending: Cardiology | Admitting: Cardiology

## 2024-03-25 ENCOUNTER — Encounter: Payer: Self-pay | Admitting: Cardiology

## 2024-03-25 VITALS — BP 140/80 | HR 71 | Ht 70.0 in | Wt 180.0 lb

## 2024-03-25 DIAGNOSIS — Z7689 Persons encountering health services in other specified circumstances: Secondary | ICD-10-CM | POA: Diagnosis present

## 2024-03-25 DIAGNOSIS — Z720 Tobacco use: Secondary | ICD-10-CM | POA: Insufficient documentation

## 2024-03-25 DIAGNOSIS — R011 Cardiac murmur, unspecified: Secondary | ICD-10-CM | POA: Diagnosis present

## 2024-03-25 DIAGNOSIS — I10 Essential (primary) hypertension: Secondary | ICD-10-CM | POA: Insufficient documentation

## 2024-03-25 DIAGNOSIS — E785 Hyperlipidemia, unspecified: Secondary | ICD-10-CM | POA: Insufficient documentation

## 2024-03-25 DIAGNOSIS — I340 Nonrheumatic mitral (valve) insufficiency: Secondary | ICD-10-CM | POA: Insufficient documentation

## 2024-03-25 NOTE — Patient Instructions (Addendum)
 Medication Instructions:  Your physician recommends that you continue on your current medications as directed. Please refer to the Current Medication list given to you today.  *If you need a refill on your cardiac medications before your next appointment, please call your pharmacy*  Lab Work: Bmet, mg, lipid panel today If you have labs (blood work) drawn today and your tests are completely normal, you will receive your results only by: MyChart Message (if you have MyChart) OR A paper copy in the mail If you have any lab test that is abnormal or we need to change your treatment, we will call you to review the results.  Testing/Procedures: Echo Your physician has requested that you have an echocardiogram. Echocardiography is a painless test that uses sound waves to create images of your heart. It provides your doctor with information about the size and shape of your heart and how well your hearts chambers and valves are working. This procedure takes approximately one hour. There are no restrictions for this procedure. Please do NOT wear cologne, perfume, aftershave, or lotions (deodorant is allowed). Please arrive 15 minutes prior to your appointment time.  Please note: We ask at that you not bring children with you during ultrasound (echo/ vascular) testing. Due to room size and safety concerns, children are not allowed in the ultrasound rooms during exams. Our front office staff cannot provide observation of children in our lobby area while testing is being conducted. An adult accompanying a patient to their appointment will only be allowed in the ultrasound room at the discretion of the ultrasound technician under special circumstances. We apologize for any inconvenience.   Follow-Up: At [facility], you and your health needs are our priority.  As part of our continuing mission to provide you with exceptional heart care, our providers are all part of one team.  This team includes  your primary Cardiologist (physician) and Advanced Practice Providers or APPs (Physician Assistants and Nurse Practitioners) who all work together to provide you with the care you need, when you need it.  Your next appointment:   6 months  Provider:   Dr. Kate  We recommend signing up for the patient portal called MyChart.  Sign up information is provided on this After Visit Summary.  MyChart is used to connect with patients for Virtual Visits (Telemedicine).  Patients are able to view lab/test results, encounter notes, upcoming appointments, etc.  Non-urgent messages can be sent to your provider as well.   To learn more about what you can do with MyChart, go to [email or web address].   Other Instructions Please check blood pressure twice a day for one week and send those readings

## 2024-04-28 ENCOUNTER — Ambulatory Visit (HOSPITAL_COMMUNITY)

## 2024-06-10 ENCOUNTER — Encounter: Admitting: Family Medicine
